# Patient Record
Sex: Female | Born: 1990 | Race: Black or African American | Hispanic: No | Marital: Single | Smoking: Never smoker
Health system: Southern US, Community
[De-identification: ages and names within clinical notes are randomized; demographics above are authoritative.]

## PROBLEM LIST (undated history)

## (undated) ENCOUNTER — Inpatient Hospital Stay (HOSPITAL_COMMUNITY): Payer: Self-pay

## (undated) DIAGNOSIS — B999 Unspecified infectious disease: Secondary | ICD-10-CM

## (undated) DIAGNOSIS — R51 Headache: Secondary | ICD-10-CM

## (undated) DIAGNOSIS — G43109 Migraine with aura, not intractable, without status migrainosus: Principal | ICD-10-CM

## (undated) HISTORY — PX: NO PAST SURGERIES: SHX2092

## (undated) HISTORY — DX: Migraine with aura, not intractable, without status migrainosus: G43.109

## (undated) HISTORY — DX: Headache: R51

---
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
## 2011-06-17 ENCOUNTER — Emergency Department (INDEPENDENT_AMBULATORY_CARE_PROVIDER_SITE_OTHER): Payer: Self-pay

## 2011-06-17 ENCOUNTER — Emergency Department (HOSPITAL_COMMUNITY)
Admission: EM | Admit: 2011-06-17 | Discharge: 2011-06-17 | Disposition: A | Discharge status: Home / Self Care | Payer: Self-pay | Source: Home / Self Care | Attending: Emergency Medicine | Admitting: Emergency Medicine

## 2011-06-17 ENCOUNTER — Encounter (HOSPITAL_COMMUNITY): Payer: Self-pay | Admitting: *Deleted

## 2011-06-17 DIAGNOSIS — R209 Unspecified disturbances of skin sensation: Secondary | ICD-10-CM

## 2011-06-17 DIAGNOSIS — R42 Dizziness and giddiness: Secondary | ICD-10-CM

## 2011-06-17 DIAGNOSIS — M549 Dorsalgia, unspecified: Secondary | ICD-10-CM

## 2011-06-17 DIAGNOSIS — R079 Chest pain, unspecified: Secondary | ICD-10-CM

## 2011-06-17 DIAGNOSIS — R202 Paresthesia of skin: Secondary | ICD-10-CM

## 2011-06-17 LAB — POCT I-STAT, CHEM 8
BUN: 9 mg/dL (ref 6–23)
Calcium, Ion: 1.25 mmol/L (ref 1.12–1.32)
Creatinine, Ser: 0.9 mg/dL (ref 0.50–1.10)
Hemoglobin: 14.3 g/dL (ref 12.0–15.0)
Sodium: 142 mEq/L (ref 135–145)
TCO2: 25 mmol/L (ref 0–100)

## 2011-06-17 NOTE — Discharge Instructions (Signed)
Back Exercises  Back exercises help treat and prevent back injuries. The goal is to increase your strength in your belly (abdominal) and back muscles. These exercises can also help with flexibility. Start these exercises when told by your doctor.  HOME CARE  Back exercises include:  Pelvic Tilt.   Lie on your back with your knees bent. Tilt your pelvis until the lower part of your back is against the floor. Hold this position 5 to 10 sec. Repeat this exercise 5 to 10 times.  Knee to Chest.   Pull 1 knee up against your chest and hold for 20 to 30 seconds. Repeat this with the other knee. This may be done with the other leg straight or bent, whichever feels better. Then, pull both knees up against your chest.  Sit-Ups or Curl-Ups.   Bend your knees 90 degrees. Start with tilting your pelvis, and do a partial, slow sit-up. Only lift your upper half 30 to 45 degrees off the floor. Take at least 2 to 3 seonds for each sit-up. Do not do sit-ups with your knees out straight. If partial sit-ups are difficult, simply do the above but with only tightening your belly (abdominal) muscles and holding it as told.  Hip-Lift.   Lie on your back with your knees flexed 90 degrees. Push down with your feet and shoulders as you raise your hips 2 inches off the floor. Hold for 10 seconds, repeat 5 to 10 times.  Back Arches.   Lie on your stomach. Prop yourself up on bent elbows. Slowly press on your hands, causing an arch in your low back. Repeat 3 to 5 times.  Shoulder-Lifts.   Lie face down with arms beside your body. Keep hips and belly pressed to floor as you slowly lift your head and shoulders off the floor.  Do not overdo your exercises. Be careful in the beginning. Exercises may cause you some mild back discomfort. If the pain lasts for more than 15 minutes, stop the exercises until you see your doctor. Improvement with exercise for back problems is slow.   Document Released: 03/11/2010 Document Revised: 01/26/2011  Document Reviewed: 03/11/2010  [employer] Patient Information 2012 [employer]  Back Pain, Adult  Low back pain is very common. About 1 in 5 people have back pain.The cause of low back pain is rarely dangerous. The pain often gets better over time.About half of people with a sudden onset of back pain feel better in just 2 weeks. About 8 in 10 people feel better by 6 weeks.   CAUSES  Some common causes of back pain include:   Strain of the muscles or ligaments supporting the spine.   Wear and tear (degeneration) of the spinal discs.   Arthritis.   Direct injury to the back.  DIAGNOSIS  Most of the time, the direct cause of low back pain is not known.However, back pain can be treated effectively even when the exact cause of the pain is unknown.Answering your caregiver's questions about your overall health and symptoms is one of the most accurate ways to make sure the cause of your pain is not dangerous. If your caregiver needs more information, he or she may order lab work or imaging tests (X-rays or MRIs).However, even if imaging tests show changes in your back, this usually does not require surgery.  HOME CARE INSTRUCTIONS  For many people, back pain returns.Since low back pain is rarely dangerous, it is often a condition that people can learn to manageon   sit, drive, or stand in one place for more than 30 minutes at a time. Take short walks on level surfaces as soon as pain allows.Try to increase the length of time you walk each day.   Do not stay in bed.Resting more than 1 or 2 days can delay your recovery.   Do not avoid exercise or work.Your body is made to move.It is not dangerous to be active, even though your back may hurt.Your back will likely heal faster if you return to being active before your pain is gone.   Pay attention to your body when you bend and lift. Many people have less  discomfortwhen lifting if they bend their knees, keep the load close to their bodies,and avoid twisting. Often, the most comfortable positions are those that put less stress on your recovering back.   Find a comfortable position to sleep. Use a firm mattress and lie on your side with your knees slightly bent. If you lie on your back, put a pillow under your knees.   Only take over-the-counter or prescription medicines as directed by your caregiver. Over-the-counter medicines to reduce pain and inflammation are often the most helpful.Your caregiver may prescribe muscle relaxant drugs.These medicines help dull your pain so you can more quickly return to your normal activities and healthy exercise.   Put ice on the injured area.   Put ice in a plastic bag.   Place a towel between your skin and the bag.   Leave the ice on for 15 to 20 minutes, 3 to 4 times a day for the first 2 to 3 days. After that, ice and heat may be alternated to reduce pain and spasms.   Ask your caregiver about trying back exercises and gentle massage. This may be of some benefit.   Avoid feeling anxious or stressed.Stress increases muscle tension and can worsen back pain.It is important to recognize when you are anxious or stressed and learn ways to manage it.Exercise is a great option.  SEEK MEDICAL CARE IF:  You have pain that is not relieved with rest or medicine.   You have pain that does not improve in 1 week.   You have new symptoms.   You are generally not feeling well.  SEEK IMMEDIATE MEDICAL CARE IF:   You have pain that radiates from your back into your legs.   You develop new bowel or bladder control problems.   You have unusual weakness or numbness in your arms or legs.   You develop nausea or vomiting.   You develop abdominal pain.   You feel faint.  Document Released: 02/06/2005 Document Revised: 01/26/2011 Document Reviewed: 06/27/2010 [facility] Patient Information 2012 [place],  [place].Chest Pain (Nonspecific) It is often hard to give a specific diagnosis for the cause of chest pain. There is always a chance that your pain could be related to something serious, such as a heart attack or a blood clot in the lungs. You need to follow up with your caregiver for further evaluation. CAUSES   Heartburn.   Pneumonia or bronchitis.   Anxiety or stress.   Inflammation around your heart (pericarditis) or lung (pleuritis or pleurisy).   A blood clot in the lung.   A collapsed lung (pneumothorax). It can develop suddenly on its own (spontaneous pneumothorax) or from injury (trauma) to the chest.   Shingles infection (herpes zoster virus).  The chest wall is composed of bones, muscles, and cartilage. Any of these can be the source of the pain.  The bones can be bruised by injury.   The muscles or cartilage can be strained by coughing or overwork.   The cartilage can be affected by inflammation and become sore (costochondritis).  DIAGNOSIS  Lab tests or other studies, such as X-rays, electrocardiography, stress testing, or cardiac imaging, may be needed to find the cause of your pain.  TREATMENT   Treatment depends on what may be causing your chest pain. Treatment may include:   Acid blockers for heartburn.   Anti-inflammatory medicine.   Pain medicine for inflammatory conditions.   Antibiotics if an infection is present.   You may be advised to change lifestyle habits. This includes stopping smoking and avoiding alcohol, caffeine, and chocolate.   You may be advised to keep your head raised (elevated) when sleeping. This reduces the chance of acid going backward from your stomach into your esophagus.   Most of the time, nonspecific chest pain will improve within 2 to 3 days with rest and mild pain medicine.  HOME CARE INSTRUCTIONS   If antibiotics were prescribed, take your antibiotics as directed. Finish them even if you start to feel better.   For the next  few days, avoid physical activities that bring on chest pain. Continue physical activities as directed.   Do not smoke.   Avoid drinking alcohol.   Only take over-the-counter or prescription medicine for pain, discomfort, or fever as directed by your caregiver.   Follow your caregiver's suggestions for further testing if your chest pain does not go away.   Keep any follow-up appointments you made. If you do not go to an appointment, you could develop lasting (chronic) problems with pain. If there is any problem keeping an appointment, you must call to reschedule.  SEEK MEDICAL CARE IF:   You think you are having problems from the medicine you are taking. Read your medicine instructions carefully.   Your chest pain does not go away, even after treatment.   You develop a rash with blisters on your chest.  SEEK IMMEDIATE MEDICAL CARE IF:   You have increased chest pain or pain that spreads to your arm, neck, jaw, back, or abdomen.   You develop shortness of breath, an increasing cough, or you are coughing up blood.   You have severe back or abdominal pain, feel nauseous, or vomit.   You develop severe weakness, fainting, or chills.   You have a fever.  THIS IS AN EMERGENCY. Do not wait to see if the pain will go away. Get medical help at once. Call your local emergency services (911 in [place] Do not drive yourself to the hospital. MAKE SURE YOU:   Understand these instructions.   Will watch your condition.   Will get help right away if you are not doing well or get worse.  Document Released: 11/16/2004 Document Revised: 01/26/2011 Document Reviewed: 09/12/2007 [facility] Patient Information 2012 [place], [place].Dizziness Dizziness is a common problem. It is a feeling of unsteadiness or lightheadedness. You may feel like you are about to faint. Dizziness can lead to injury if you stumble or fall. A person of any age group can suffer from dizziness, but dizziness is more common in  older adults. CAUSES  Dizziness can be caused by many different things, including:  Middle ear problems.   Standing for too long.   Infections.   An allergic reaction.   Aging.   An emotional response to something, such as the sight of blood.   Side effects of medicines.  Fatigue.   Problems with circulation or blood pressure.   Excess use of alcohol, medicines, or illegal drug use.   Breathing too fast (hyperventilation).   An arrhythmia or problems with your heart rhythm.   Low red blood cell count (anemia).   Pregnancy.   Vomiting, diarrhea, fever, or other illnesses that cause dehydration.   Diseases or conditions such as Parkinson's disease, high blood pressure (hypertension), diabetes, and thyroid problems.   Exposure to extreme heat.  DIAGNOSIS  To find the cause of your dizziness, your caregiver may do a physical exam, lab tests, radiologic imaging scans, or an electrocardiography test (ECG).  TREATMENT  Treatment of dizziness depends on the cause of your symptoms and can vary greatly. HOME CARE INSTRUCTIONS   Drink enough fluids to keep your urine clear or pale yellow. This is especially important in very hot weather. In the elderly, it is also important in cold weather.   If your dizziness is caused by medicines, take them exactly as directed. When taking blood pressure medicines, it is especially important to get up slowly.   Rise slowly from chairs and steady yourself until you feel okay.   In the morning, first sit up on the side of the bed. When this seems okay, stand slowly while holding onto something until you know your balance is fine.   If you need to stand in one place for a long time, be sure to move your legs often. Tighten and relax the muscles in your legs while standing.   If dizziness continues to be a problem, have someone stay with you for a day or two. Do this until you feel you are well enough to stay alone. Have the person call your  caregiver if he or she notices changes in you that are concerning.   Do not drive or use heavy machinery if you feel dizzy.  SEEK IMMEDIATE MEDICAL CARE IF:   Your dizziness or lightheadedness gets worse.   You feel nauseous or vomit.   You develop problems with talking, walking, weakness, or using your arms, hands, or legs.   You are not thinking clearly or you have difficulty forming sentences. It may take a friend or family member to determine if your thinking is normal.   You develop chest pain, abdominal pain, shortness of breath, or sweating.   Your vision changes.   You notice any bleeding.   You have side effects from medicine that seems to be getting worse rather than better.  MAKE SURE YOU:   Understand these instructions.   Will watch your condition.   Will get help right away if you are not doing well or get worse.  Document Released: 08/02/2000 Document Revised: 01/26/2011 Document Reviewed: 08/26/2010 [facility] Patient Information 2012 [place], [place].Neuropathic Pain We often think that pain has a physical cause. If we get rid of the cause, the pain should go away. Nerves themselves can also cause pain. It is called neuropathic pain, which means nerve abnormality. It may be difficult for the patients who have it and for the treating caregivers. Pain is usually described as acute (short-lived) or chronic (long-lasting). Acute pain is related to the physical sensations caused by an injury. It can last from a few seconds to many weeks, but it usually goes away when normal healing occurs. Chronic pain lasts beyond the typical healing time. With neuropathic pain, the nerve fibers themselves may be damaged or injured. They then send incorrect signals to other pain centers. The pain  you feel is real, but the cause is not easy to find.  CAUSES  Chronic pain can result from diseases, such as diabetes and shingles (an infection related to chickenpox), or from trauma, surgery, or  amputation. It can also happen without any known injury or disease. The nerves are sending pain messages, even though there is no identifiable cause for such messages.   Other common causes of neuropathy include diabetes, phantom limb pain, or Regional Pain Syndrome (RPS).   As with all forms of chronic back pain, if neuropathy is not correctly treated, there can be a number of associated problems that lead to a downward cycle for the patient. These include depression, sleeplessness, feelings of fear and anxiety, limited social interaction and inability to do normal daily activities or work.   The most dramatic and mysterious example of neuropathic pain is called "phantom limb syndrome." This occurs when an arm or a leg has been removed because of illness or injury. The brain still gets pain messages from the nerves that originally carried impulses from the missing limb. These nerves now seem to misfire and cause troubling pain.   Neuropathic pain often seems to have no cause. It responds poorly to standard pain treatment.  Neuropathic pain can occur after:  Shingles (herpes zoster virus infection).   A lasting burning sensation of the skin, caused usually by injury to a peripheral nerve.   Peripheral neuropathy which is widespread nerve damage, often caused by diabetes or alcoholism.   Phantom limb pain following an amputation.   Facial nerve problems (trigeminal neuralgia).   Multiple sclerosis.   Reflex sympathetic dystrophy.   Pain which comes with cancer and cancer chemotherapy.   Entrapment neuropathy such as when pressure is put on a nerve such as in carpal tunnel syndrome.   Back, leg, and hip problems (sciatica).   Spine or back surgery.   HIV Infection or AIDS where nerves are infected by viruses.  Your caregiver can explain items in the above list which may apply to you. SYMPTOMS  Characteristics of neuropathic pain are:  Severe, sharp, electric shock-like, shooting,  lightening-like, knife-like.   Pins and needles sensation.   Deep burning, deep cold, or deep ache.   Persistent numbness, tingling, or weakness.   Pain resulting from light touch or other stimulus that would not usually cause pain.   Increased sensitivity to something that would normally cause pain, such as a pinprick.  Pain may persist for months or years following the healing of damaged tissues. When this happens, pain signals no longer sound an alarm about current injuries or injuries about to happen. Instead, the alarm system itself is not working correctly.  Neuropathic pain may get worse instead of better over time. For some people, it can lead to serious disability. It is important to be aware that severe injury in a limb can occur without a proper, protective pain response.Burns, cuts, and other injuries may go unnoticed. Without proper treatment, these injuries can become infected or lead to further disability. Take any injury seriously, and consult your caregiver for treatment. DIAGNOSIS  When you have a pain with no known cause, your caregiver will probably ask some specific questions:   Do you have any other conditions, such as diabetes, shingles, multiple sclerosis, or HIV infection?   How would you describe your pain? (Neuropathic pain is often described as shooting, stabbing, burning, or searing.)   Is your pain worse at any time of the day? (Neuropathic pain is usually  worse at night.)   Does the pain seem to follow a certain physical pathway?   Does the pain come from an area that has missing or injured nerves? (An example would be phantom limb pain.)   Is the pain triggered by minor things such as rubbing against the sheets at night?  These questions often help define the type of pain involved. Once your caregiver knows what is happening, treatment can begin. Anticonvulsant, antidepressant drugs, and various pain relievers seem to work in some cases. If another  condition, such as diabetes is involved, better management of that disorder may relieve the neuropathic pain.  TREATMENT  Neuropathic pain is frequently long-lasting and tends not to respond to treatment with narcotic type pain medication. It may respond well to other drugs such as antiseizure and antidepressant medications. Usually, neuropathic problems do not completely go away, but partial improvement is often possible with proper treatment. Your caregivers have large numbers of medications available to treat you. Do not be discouraged if you do not get immediate relief. Sometimes different medications or a combination of medications will be tried before you receive the results you are hoping for. See your caregiver if you have pain that seems to be coming from nowhere and does not go away. Help is available.  SEEK IMMEDIATE MEDICAL CARE IF:   There is a sudden change in the quality of your pain, especially if the change is on only one side of the body.   You notice changes of the skin, such as redness, black or purple discoloration, swelling, or an ulcer.   You cannot move the affected limbs.  Document Released: 11/04/2003 Document Revised: 01/26/2011 Document Reviewed: 11/04/2003 [facility] Patient Information 2012 [place], [place].

## 2011-06-17 NOTE — ED Provider Notes (Signed)
Chief Complaint  Patient presents with  . Numbness  . Chest Pain  . Dizziness    History of Present Illness:   Marie Hampton is a 21 year old female who developed numbness and tingling in her entire left arm from the shoulder on down to the tips of the fingers 4 days ago. This has persisted. It feels like a tightness in the arm feels weak and 2 days ago the same thing happened with her left leg with numbness, tingling, tightness, and a weak feeling. She denies any swelling, pain in the arm or leg, headache, diplopia, blurred vision, or difficulty with speech, swallowing, or ambulation. Over the past 3 days she's felt dizzy and lightheaded. She denies any vertigo or presyncope. This is worse with head movement. She had chest pain off-and-on for the past one to 2 years. This is sharp and left temporal lasting about 5 seconds at a time and worse if she moves. This comes and goes and is unrelated to position, exertion, activity, or meals. She is at a today history of nausea but no vomiting. This tends to occur after meals she denies abdominal pain or diarrhea. Since this morning she's had pain in her left lower back area. She also has hot spells and occasional headaches. She denies any fever, chills, sore throat, swollen glands, neck pain, coughing, wheezing, shortness of breath, urinary or GYN complaints.  Review of Systems:  Other than noted above, the patient denies any of the following symptoms: Systemic:  No fever, chills, fatigue, photophobia, stiff neck. Eye:  No redness, eye pain, discharge, blurred vision, or diplopia. ENT:  No nasal congestion, rhinorrhea, sinus pressure or pain, sneezing, earache, or sore throat.  No jaw claudication. Neuro:  No paresthesias, loss of consciousness, seizure activity, muscle weakness, trouble with coordination or gait, trouble speaking or swallowing. Psych:  No depression, anxiety or trouble sleeping.  PMFSH:  Past medical history, family history, social history,  meds, and allergies were reviewed.  Physical Exam:   Vital signs:  BP 112/71  Pulse 80  Temp(Src) 98.6 F (37 C) (Oral)  Resp 18  SpO2 100%  LMP 06/13/2011 General:  Alert and oriented.  In no distress. Eye:  Lids and conjunctivas normal.  PERRL,  Full EOMs.  Fundi benign with normal discs and vessels. ENT:  No cranial or facial tenderness to palpation.  TMs and canals clear.  Nasal mucosa was normal and uncongested without any drainage. No intra oral lesions, pharynx clear, mucous membranes moist, dentition normal. Neck:  Supple, full ROM, no tenderness to palpation.  No adenopathy or mass. Lungs: Clear to auscultation. Heart: Regular rhythm, no gallops or murmurs. Chest: No chest wall tenderness. Abdomen: Soft, nontender, no organomegaly or mass. Bowel sounds are normal. Extremities: No edema or tenderness, pulses were full. Neuro:  Alert and orented times 3.  Speech was clear, fluent, and appropriate.  Cranial nerves intact. No pronator drift, muscle strength normal. Finger to nose normal.  DTRs 2+ .Station and gait were normal.  Romberg's sign was normal.  Able to perform tandem gait well. Psych:  Normal affect.  Dg Chest 2 View  06/17/2011  *RADIOLOGY REPORT*  Clinical Data: Chest pain for 2 years  CHEST - 2 VIEW  Comparison: 10/29/2005  Findings: The heart size and mediastinal contours are within normal limits.  Both lungs are clear.  The visualized skeletal structures are unremarkable.  IMPRESSION: Negative exam.  Original Report Authenticated By: Rosealee Albee, M.D.   Results for orders placed during  the hospital encounter of 06/17/11  POCT I-STAT, CHEM 8      Component Value Range   Sodium 142  135 - 145 (mEq/L)   Potassium 4.1  3.5 - 5.1 (mEq/L)   Chloride 106  96 - 112 (mEq/L)   BUN 9  6 - 23 (mg/dL)   Creatinine, Ser 1.61  0.50 - 1.10 (mg/dL)   Glucose, Bld 91  70 - 99 (mg/dL)   Calcium, Ion 0.96  0.45 - 1.32 (mmol/L)   TCO2 25  0 - 100 (mmol/L)   Hemoglobin 14.3   12.0 - 15.0 (g/dL)   HCT 40.9  81.1 - 91.4 (%)     Date: 06/17/2011  Rate: 82  Rhythm: sinus arrhythmia  QRS Axis: normal  Intervals: normal  ST/T Wave abnormalities: normal  Conduction Disutrbances:none  Narrative Interpretation: Normal sinus rhythm with sinus arrhythmia, otherwise normal EKG.  Old EKG Reviewed: none available    Assessment:  The primary encounter diagnosis was Paresthesias. Diagnoses of Chest pain, Back pain, and Dizziness were also pertinent to this visit. The cause of her symptoms remains unknown. I am concerned about the possibility of multiple sclerosis, and have suggested that she followup with a neurologist. There is no evidence of a stroke or any o    ther acute neurological illness.  Plan:   1.  The following meds were prescribed:   New Prescriptions   No medications on file   2.  The patient was instructed in symptomatic care and handouts were given. 3.  The patient was told to return if becoming worse in any way, if no better in 3 or 4 days, and given some red flag symptoms that would indicate earlier return.  Follow up with Dr. Porfirio Mylar Dohmeier next week.    Reuben Likes, MD 06/17/11 2113

## 2011-06-17 NOTE — ED Notes (Signed)
C/O constant LUE x 2 days, that progressed into LLE as well yesterday.  Today numbness is intermittent.  Also c/o left chest pains to side of breast intermittently over past few days.  C/O lightheadedness intermittently x 2 days.  Has taken IBU without change in sxs.

## 2012-04-10 ENCOUNTER — Encounter (HOSPITAL_COMMUNITY): Payer: Self-pay | Admitting: Emergency Medicine

## 2012-04-10 DIAGNOSIS — G43909 Migraine, unspecified, not intractable, without status migrainosus: Secondary | ICD-10-CM | POA: Insufficient documentation

## 2012-04-10 DIAGNOSIS — Z3202 Encounter for pregnancy test, result negative: Secondary | ICD-10-CM | POA: Insufficient documentation

## 2012-04-10 NOTE — ED Notes (Addendum)
C/o headache x 2 weeks with intermittent episodes of alternating arms feeling "weird."  Reports LMP started today and is 1 week early. No neuro deficits noted on triage exam.

## 2012-04-11 ENCOUNTER — Encounter (HOSPITAL_COMMUNITY): Payer: Self-pay | Admitting: Radiology

## 2012-04-11 ENCOUNTER — Emergency Department (HOSPITAL_COMMUNITY): Payer: Self-pay

## 2012-04-11 ENCOUNTER — Emergency Department (HOSPITAL_COMMUNITY)
Admission: EM | Admit: 2012-04-11 | Discharge: 2012-04-11 | Disposition: A | Discharge status: Home / Self Care | Payer: Self-pay | Attending: Emergency Medicine | Admitting: Emergency Medicine

## 2012-04-11 MED ORDER — KETOROLAC TROMETHAMINE 60 MG/2ML IM SOLN
60.0000 mg | Freq: Once | INTRAMUSCULAR | Status: AC
  Administered 2012-04-11: 60 mg via INTRAMUSCULAR
  Filled 2012-04-11: qty 2

## 2012-04-11 MED ORDER — METOCLOPRAMIDE HCL 5 MG/ML IJ SOLN
10.0000 mg | Freq: Once | INTRAMUSCULAR | Status: AC
  Administered 2012-04-11: 10 mg via INTRAMUSCULAR
  Filled 2012-04-11: qty 2

## 2012-04-11 MED ORDER — DIPHENHYDRAMINE HCL 50 MG/ML IJ SOLN
12.5000 mg | Freq: Once | INTRAMUSCULAR | Status: AC
  Administered 2012-04-11: 12.5 mg via INTRAMUSCULAR
  Filled 2012-04-11: qty 1

## 2012-04-11 NOTE — ED Notes (Signed)
Pt discharged.Vital signs stable and GCS 15 

## 2012-04-11 NOTE — ED Provider Notes (Signed)
History     CSN: [id number]  Arrival date & time 04/10/12  2144   First MD Initiated Contact with Patient 04/11/12 0123      Chief Complaint  Patient presents with  . Headache    (Consider location/radiation/quality/duration/timing/severity/associated sxs/prior treatment) Patient is a 22 y.o. female presenting with headaches. The history is provided by the patient.  Headache Pain location:  R parietal Quality:  Dull Radiates to:  Does not radiate Severity currently:  7/10 Severity at highest:  7/10 Onset quality:  Gradual Duration:  2 weeks Timing:  Constant Progression:  Unchanged Chronicity:  Recurrent Similar to prior headaches: yes   Context: not intercourse   Relieved by:  NSAIDs Worsened by:  Nothing tried Ineffective treatments:  None tried Associated symptoms: no back pain, no fever, no nausea, no neck pain, no photophobia and no seizures     History reviewed. No pertinent past medical history.  History reviewed. No pertinent past surgical history.  No family history on file.  History  Substance Use Topics  . Smoking status: Not on file  . Smokeless tobacco: Not on file  . Alcohol Use: Yes     Comment: Occasional    OB History   Grav Para Term Preterm Abortions TAB SAB Ect Mult Living                  Review of Systems  Constitutional: Negative for fever.  HENT: Negative for neck pain.   Eyes: Negative for photophobia.  Gastrointestinal: Negative for nausea.  Musculoskeletal: Negative for back pain.  Neurological: Positive for headaches. Negative for seizures, speech difficulty and weakness.  All other systems reviewed and are negative.    Allergies  Review of patient's allergies indicates no known allergies.  Home Medications   Current Outpatient Rx  Name  Route  Sig  Dispense  Refill  . Norethindrone-Eth Estradiol (BREVICON, 28, PO)   Oral   Take by mouth.           BP 115/75  Pulse 73  Temp(Src) 98.8 F (37.1 C) (Oral)   Resp 18  SpO2 100%  LMP 04/10/2012  Physical Exam  Constitutional: She is oriented to person, place, and time. She appears well-developed and well-nourished.  HENT:  Head: Normocephalic and atraumatic.  Mouth/Throat: Oropharynx is clear and moist.  Eyes: Conjunctivae and EOM are normal. Pupils are equal, round, and reactive to light.  Neck: Normal range of motion. Neck supple.  No meningeal signs no temporal pain  Cardiovascular: Normal rate, regular rhythm and intact distal pulses.   Pulmonary/Chest: Effort normal and breath sounds normal.  Abdominal: Soft. Bowel sounds are normal. There is no tenderness.  Musculoskeletal: Normal range of motion.  Neurological: She is alert and oriented to person, place, and time. She displays normal reflexes. No cranial nerve deficit. Coordination normal.  5/5 strength throughout, sensation intact B  Skin: Skin is warm and dry.  Psychiatric: She has a normal mood and affect.    ED Course  Procedures (including critical care time)  Labs Reviewed  POCT PREGNANCY, URINE   No results found.   No diagnosis found.    MDM  Exam and vitals reassuring.  There is no change in cognition.  No fevers.  No neck pain no indication for LP.  Follow up for ongoing care.  Return for changes in mental status fever or stiff neck        Hawley Michel K Deliyah Muckle-Rasch, MD 04/11/12 [phone]

## 2012-04-19 ENCOUNTER — Encounter: Payer: Self-pay | Admitting: Neurology

## 2012-04-19 DIAGNOSIS — G43111 Migraine with aura, intractable, with status migrainosus: Secondary | ICD-10-CM | POA: Insufficient documentation

## 2012-05-13 ENCOUNTER — Ambulatory Visit (INDEPENDENT_AMBULATORY_CARE_PROVIDER_SITE_OTHER): Payer: PRIVATE HEALTH INSURANCE | Admitting: Neurology

## 2012-05-13 ENCOUNTER — Encounter: Payer: Self-pay | Admitting: Neurology

## 2012-05-13 VITALS — BP 108/78 | HR 92 | Temp 98.9°F | Ht 62.0 in | Wt 143.0 lb

## 2012-05-13 DIAGNOSIS — G43109 Migraine with aura, not intractable, without status migrainosus: Secondary | ICD-10-CM | POA: Insufficient documentation

## 2012-05-13 HISTORY — DX: Migraine with aura, not intractable, without status migrainosus: G43.109

## 2012-05-13 NOTE — Patient Instructions (Addendum)
I think overall you are doing well and your exam is normal. Your migraine medicine, gabapentin, can be taken as needed at night.  Please make sure that you drink plenty of fluids. I would like for you to exercise daily for example in the form of walking 20-30 minutes every day. Please keep regular sleep-wake schedule. And keep regular mealtimes, do not skip any meals, eat  healthy snacks in between meals. Try to eat protein with every meal. Let's not make any changes in your medications at this point. I think you're stable enough that I can see you back as needed. Please call us if you have any interim questions, concerns, or problems or updates to discuss. Our phone number is [phone] and Brett Canales is my Geophysicist/field seismologist.

## 2012-05-13 NOTE — Progress Notes (Signed)
Subjective:    Patient ID: Marie Hampton is a 22 y.o. female.  HPI Interim history:  Marie Hampton is a very pleasant 22 y.o.-year-old Right-handed female, with a benign PMH, who presents for followup consultation of her HAs. The patient is accompanied by her sister today. I first met her and her boyfriend on 04/12/2012 at which time I suggested a trial of gabapentin for her ongoing migrainous headaches. At that time she was reporting a continuous headache, going on for 2 weeks. She had presented to the [facility] ER for a gradual onset R posterior-parietal HA, described as burning and throbbing initially, then constant pain, which then traveled to L side. Severity was mostly 7/10, but also 10/10. She has had almost complete resolution of her headaches. She had reported intermittent tingling in L and less so on R arm. She had associated photophobia, sonophobia and nausea, no vomiting. Went to [facility] ER 04/11/12 and had CTH, which was N. I reviewed the CT report and had explained the findings to her. She received 3 IM shots, one of which was Benadrly, and Toradol, per BF. She is doing better, she states. She did build up the gabapentin dose as instructed to 3 pill qHS and took this for about a week and since she felt improved, she discontinued the med. She has an intermittent mild HA off and on now and not more severe than 5/10. She is without new complaints today. She had no SEs with the gabapentin.    Her  Past Medical History  Diagnosis Date  . Headache   . Migraine with aura 05/13/2012    Her History reviewed. No pertinent past surgical history.  Her  Family History  Problem Relation Age of Onset  . Diabetes Other   . Heart disease Other     Her  History   Social History  . Marital Status: Married    Spouse Name: N/A    Number of Children: N/A  . Years of Education: N/A   Occupational History  . Student    Social History Main Topics  . Smoking status: Never Smoker   . Smokeless  tobacco: None  . Alcohol Use: 1.5 oz/week    3 drink(s) per week     Comment: Occasional  . Drug Use: 1.00 per week    Special: Marijuana  . Sexually Active: None   Other Topics Concern  . None   Social History Narrative    Right handed 22 year old AA female is not married and lives at home with her mother, father and sister.  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  She occasionally drinks alcohol and uses recreational drugs socially.    Her No Known Allergies:   Her  Outpatient Encounter Prescriptions as of 05/13/2012  Medication Sig Dispense Refill  . aspirin-acetaminophen-caffeine (EXCEDRIN MIGRAINE) 250-250-65 MG per tablet Take 1 tablet by mouth once.      . gabapentin (NEURONTIN) 100 MG capsule Take 100 mg by mouth 3 (three) times daily.       No facility-administered encounter medications on file as of 05/13/2012.   Review of Systems  Constitutional: Positive for fatigue.  HENT: Positive for rhinorrhea.        Seasonal allergies  Respiratory: Positive for cough.   Gastrointestinal: Positive for diarrhea.  Neurological: Positive for headaches.    Objective:  Neurologic Exam  Physical Exam  Physical Examination:   Filed Vitals:   05/13/12 1129  BP: 108/78  Pulse: 92  Temp: 98.9 F (37.2 C)    General Examination: The patient is a very pleasant 22 y.o. female in no acute distress.  On general exam: She is a very pleasant young female in no acute distress. She is currently having a headache 4/10, but she admits to not having breakfast this morning. She appears comfortable. She is conversant. She is no photophobic. HEENT exam: Normocephalic, atraumatic, pupils are equal, round and reactive to light and accommodation. Funduscopic exam is normal bilaterally. There is cerumen impaction on the L. Hearing is intact. Face is symmetric with normal facial animation. She has normal speech. Neck is supple with full range of motion. She has no meningeal signs.  Oropharynx is clear. Tonsils are 2+. They have a mildly cryptic appearance. She has no pharyngeal erythema or postnasal drip. Tongue protrudes centrally and palate elevates symmetrically. Chest is clear to auscultation without wheezing or rhonchi. Heart sounds are normal without murmurs, rubs or gallops. Abdomen is soft, nontender with normal bowel sounds. She has no pitting edema in the distal lower extremities bilaterally. Skin is warm and dry. She has no joint deformities. She has a few tattoos up of her right collarbone and and posterior neck. Neurologically: Mental status: The patient is awake, alert and oriented in all 4 spheres. Her memory, attention, language and knowledge are appropriate. Cranial nerves are as described under HEENT exam. Motor exam: Normal bulk, strength and tone is noted. She has no drift or tremor or rebound. Romberg is negative. Reflexes are 2+ throughout. Cerebellar testing shows no dysmetria or intention tremor on finger to nose testing. She has intact fine motor skills. She has no truncal or gait ataxia. Sensory exam is intact to light touch, pinprick, vibration and temperature sense. Gait, station and balance are unremarkable. She is able to perform tandem walk and pull herself up on her toes and heels.  Assessment and Plan:     In summary, Ms. Deckman is a very pleasant 22 year old woman with a history consistent with status migrainosus, now resolved. She has a normal neurological exam and looks well.. She is advised that she has a nonfocal exam and normal head CT both of which are very reassuring. She is advised about the diagnosis of migraine w aura, its prognosis and treatment options. I would for her to use gabapentin as needed. She is stable and can FU with me on an as needed basis. She was in agreement. If she has any new onset symptoms such as one-sided weakness, numbness, sudden vision loss or slurring of speech she is advised to go to the emergency room. She is  advised to call with any other problems, concerns or questions or updates over the phone next week.

## 2015-02-21 NOTE — L&D Delivery Note (Signed)
Delivery Note 0154: At bedside after nurse reports patient C/C/+2 and pushing with good efforts.  Cat II FT as variable decelerations noted with pushing efforts.  Patient with O2 via NRB mask and s/p fluid bolus.  Patient delivered as below with staff and family support.   At 2:21 AM, on Nov. 30, 2017, a viable female "Marie Hampton" was delivered via Vaginal, Spontaneous Delivery (Presentation: Right Occiput Anterior with restitution to ROT). Shoulders delivered easily and mother encouraged to embrace infant and deliver body. Infant placed on mother's abdomen with semi-flaccid tone and minimal grimace. Tactile stimulation given by provider and infant with improving tone and grimace.  Infant APGAR: 7,9. Cord clamped, cut, and blood collected for donation. Placenta delivered spontaneously, after 40 minutes, and noted to be intact with 3VC upon inspection.  Vaginal inspection revealed a 2nd degree perineal laceration that was repaired with 3-0 vicryl on CT-1.  Additional anesthetic necessary and patient tolerated the procedure well. Fundus firm, at the umbilicus, and bleeding small.  Mother hemodynamically stable and infant skin to skin prior to provider exit.  Mother desires birth control, but is unsure of method.  Patient does opt to breastfeed.  Infant weight at one hour of life: 7lbs 3oz, 19.75in  Anesthesia:  Epidural Episiotomy: None Lacerations: 2nd degree;Perineal Suture Repair: 3.0 vicryl Est. Blood Loss (mL): 400  Mom to postpartum.  Baby to Couplet care / Skin to Skin.  Marie RobinsJessica L Legacy Carrender MSN, CNM 01/20/2016, 3:36 AM

## 2015-05-31 ENCOUNTER — Ambulatory Visit (HOSPITAL_COMMUNITY)
Admission: EM | Admit: 2015-05-31 | Discharge: 2015-05-31 | Disposition: A | Discharge status: Home / Self Care | Payer: BLUE CROSS/BLUE SHIELD | Attending: Emergency Medicine | Admitting: Emergency Medicine

## 2015-05-31 ENCOUNTER — Encounter (HOSPITAL_COMMUNITY): Payer: Self-pay | Admitting: Emergency Medicine

## 2015-05-31 DIAGNOSIS — R102 Pelvic and perineal pain: Secondary | ICD-10-CM | POA: Diagnosis not present

## 2015-05-31 DIAGNOSIS — O26899 Other specified pregnancy related conditions, unspecified trimester: Secondary | ICD-10-CM

## 2015-05-31 LAB — POCT URINALYSIS DIP (DEVICE)
BILIRUBIN URINE: NEGATIVE
GLUCOSE, UA: NEGATIVE mg/dL
Hgb urine dipstick: NEGATIVE
KETONES UR: NEGATIVE mg/dL
LEUKOCYTES UA: NEGATIVE
NITRITE: NEGATIVE
Protein, ur: NEGATIVE mg/dL
Specific Gravity, Urine: 1.015 (ref 1.005–1.030)
Urobilinogen, UA: 0.2 mg/dL (ref 0.0–1.0)
pH: 7.5 (ref 5.0–8.0)

## 2015-05-31 LAB — POCT PREGNANCY, URINE: Preg Test, Ur: POSITIVE — AB

## 2015-05-31 NOTE — ED Provider Notes (Signed)
CSN: [id number]     Arrival date & time 05/31/15  1358 History   First MD Initiated Contact with Patient 05/31/15 1510     Chief Complaint  Patient presents with  . Abdominal Pain   (Consider location/radiation/quality/duration/timing/severity/associated sxs/prior Treatment) HPI Comments: 25 year old female complaining of epigastric pain for one week. It is intermittent and generally last for approximately 30 minutes or so it time. It occurs almost days of the week. Sometimes it is better after drinking water. Nothing seems to make it worse. She considers it to be relatively mild. The second complaint is that of mid pelvic pain for one week. It is constant and mild to moderate. It does not abate. There is no associated nausea, vomiting or diarrhea. Denies chest pain or shortness of breath. Denies vaginal bleeding or discharge. Last menstrual period was 04/19/2015. She has a nearly positive pregnancy test today. Urinalysis is clear.   Past Medical History  Diagnosis Date  . Headache(784.0)   . Migraine with aura 05/13/2012   History reviewed. No pertinent past surgical history. Family History  Problem Relation Age of Onset  . Diabetes Other   . Heart disease Other    Social History  Substance Use Topics  . Smoking status: Never Smoker   . Smokeless tobacco: None  . Alcohol Use: 1.5 oz/week    3 drink(s) per week     Comment: Occasional   OB History    No data available     Review of Systems  Constitutional: Negative for fever, activity change and fatigue.  HENT: Negative.   Respiratory: Negative.  Negative for cough and shortness of breath.   Cardiovascular: Negative.   Gastrointestinal: Positive for abdominal pain. Negative for nausea, vomiting, diarrhea and constipation.  Genitourinary: Positive for menstrual problem and pelvic pain. Negative for dysuria, urgency, frequency, hematuria, flank pain, vaginal bleeding, vaginal discharge and genital sores.  Musculoskeletal:  Negative.   Neurological: Negative.     Allergies  Review of patient's allergies indicates no known allergies.  Home Medications   Prior to Admission medications   Medication Sig Start Date End Date Taking? Authorizing Provider  aspirin-acetaminophen-caffeine (EXCEDRIN MIGRAINE) [phone] MG per tablet Take 1 tablet by mouth once.    Historical Provider, MD  gabapentin (NEURONTIN) 100 MG capsule Take 100 mg by mouth 3 (three) times daily.    Historical Provider, MD   Meds Ordered and Administered this Visit  Medications - No data to display  BP 114/65 mmHg  Pulse 93  Temp(Src) 99.8 F (37.7 C) (Oral)  Resp 16  SpO2 100%  LMP 04/19/2015 No data found.   Physical Exam  Constitutional: She is oriented to person, place, and time. She appears well-developed and well-nourished. No distress.  Eyes: EOM are normal.  Neck: Normal range of motion. Neck supple.  Cardiovascular: Normal rate.   Pulmonary/Chest: Effort normal. No respiratory distress.  Abdominal: Soft. Bowel sounds are normal. She exhibits no distension and no mass. There is no tenderness. There is no rebound and no guarding.  Minor tenderness over the mid suprapubis. No bilateral pelvic tenderness. No other abdominal tenderness.  Musculoskeletal: She exhibits no edema.  Neurological: She is alert and oriented to person, place, and time. She exhibits normal muscle tone.  Skin: Skin is warm and dry.  Psychiatric: She has a normal mood and affect.  Nursing note and vitals reviewed.   ED Course  Procedures (including critical care time)  Labs Review Labs Reviewed  POCT PREGNANCY, URINE - Abnormal; Notable  for the following:    Preg Test, Ur POSITIVE (*)    All other components within normal limits  POCT URINALYSIS DIP (DEVICE)   Results for orders placed or performed during the hospital encounter of 05/31/15  POCT urinalysis dip (device)  Result Value Ref Range   Glucose, UA NEGATIVE NEGATIVE mg/dL   Bilirubin  Urine NEGATIVE NEGATIVE   Ketones, ur NEGATIVE NEGATIVE mg/dL   Specific Gravity, Urine 1.015 1.005 - 1.030   Hgb urine dipstick NEGATIVE NEGATIVE   pH 7.5 5.0 - 8.0   Protein, ur NEGATIVE NEGATIVE mg/dL   Urobilinogen, UA 0.2 0.0 - 1.0 mg/dL   Nitrite NEGATIVE NEGATIVE   Leukocytes, UA NEGATIVE NEGATIVE  Pregnancy, urine POC  Result Value Ref Range   Preg Test, Ur POSITIVE (A) NEGATIVE     Imaging Review No results found.   Visual Acuity Review  Right Eye Distance:   Left Eye Distance:   Bilateral Distance:    Right Eye Near:   Left Eye Near:    Bilateral Near:         MDM   1. Pelvic pain during pregnancy    Patient with positive pregnancy test today. She is having mild low mid pelvic/suprapubic pain and tenderness. She is in no distress. Vital signs are stable, relaxed posturing smiling and jovial. She is accompanied by her significant other that can drive [facility] additional evaluation at this time. She understands that she needs to be evaluated today.  Verbal and written discharge instructions have been reviewed and given to the patient  by the provider to include medications and other care measures.   Hayden Rasmussenavid Omni Dunsworth, NP 05/31/15 1555  Hayden Rasmussenavid Marquesa Rath, NP 05/31/15 1556

## 2015-05-31 NOTE — Discharge Instructions (Signed)
Go directly to the [facility] now.  Pelvic Pain, Female Female pelvic pain can be caused by many different things and start from a variety of places. Pelvic pain refers to pain that is located in the lower half of the abdomen and between your hips. The pain may occur over a short period of time (acute) or may be reoccurring (chronic). The cause of pelvic pain may be related to disorders affecting the female reproductive organs (gynecologic), but it may also be related to the bladder, kidney stones, an intestinal complication, or muscle or skeletal problems. Getting help right away for pelvic pain is important, especially if there has been severe, sharp, or a sudden onset of unusual pain. It is also important to get help right away because some types of pelvic pain can be life threatening.  CAUSES  Below are only some of the causes of pelvic pain. The causes of pelvic pain can be in one of several categories.   Gynecologic.  Pelvic inflammatory disease.  Sexually transmitted infection.  Ovarian cyst or a twisted ovarian ligament (ovarian torsion).  Uterine lining that grows outside the uterus (endometriosis).  Fibroids, cysts, or tumors.  Ovulation.  Pregnancy.  Pregnancy that occurs outside the uterus (ectopic pregnancy).  Miscarriage.  Labor.  Abruption of the placenta or ruptured uterus.  Infection.  Uterine infection (endometritis).  Bladder infection.  Diverticulitis.  Miscarriage related to a uterine infection (septic abortion).  Bladder.  Inflammation of the bladder (cystitis).  Kidney stone(s).  Gastrointestinal.  Constipation.  Diverticulitis.  Neurologic.  Trauma.  Feeling pelvic pain because of mental or emotional causes (psychosomatic).  Cancers of the bowel or pelvis. EVALUATION  Your caregiver will want to take a careful history of your concerns. This includes recent changes in your health, a careful gynecologic history of your periods  (menses), and a sexual history. Obtaining your family history and medical history is also important. Your caregiver may suggest a pelvic exam. A pelvic exam will help identify the location and severity of the pain. It also helps in the evaluation of which organ system may be involved. In order to identify the cause of the pelvic pain and be properly treated, your caregiver may order tests. These tests may include:   A pregnancy test.  Pelvic ultrasonography.  An X-ray exam of the abdomen.  A urinalysis or evaluation of vaginal discharge.  Blood tests. HOME CARE INSTRUCTIONS   Only take over-the-counter or prescription medicines for pain, discomfort, or fever as directed by your caregiver.   Rest as directed by your caregiver.   Eat a balanced diet.   Drink enough fluids to make your urine clear or pale yellow, or as directed.   Avoid sexual intercourse if it causes pain.   Apply warm or cold compresses to the lower abdomen depending on which one helps the pain.   Avoid stressful situations.   Keep a journal of your pelvic pain. Write down when it started, where the pain is located, and if there are things that seem to be associated with the pain, such as food or your menstrual cycle.  Follow up with your caregiver as directed.  SEEK MEDICAL CARE IF:  Your medicine does not help your pain.  You have abnormal vaginal discharge. SEEK IMMEDIATE MEDICAL CARE IF:   You have heavy bleeding from the vagina.   Your pelvic pain increases.   You feel light-headed or faint.   You have chills.   You have pain with urination or blood  in your urine.   You have uncontrolled diarrhea or vomiting.   You have a fever or persistent symptoms for more than 3 days.  You have a fever and your symptoms suddenly get worse.   You are being physically or sexually abused.   This information is not intended to replace advice given to you by your health care provider. Make sure  you discuss any questions you have with your health care provider.   Document Released: 01/04/2004 Document Revised: 10/28/2014 Document Reviewed: 05/29/2011 Elsevier Interactive Patient Education 2016 [employer]  Abdominal Pain During Pregnancy Belly (abdominal) pain is common during pregnancy. Most of the time, it is not a serious problem. Other times, it can be a sign that something is wrong with the pregnancy. Always tell your doctor if you have belly pain. HOME CARE Monitor your belly pain for any changes. The following actions may help you feel better:  Do not have sex (intercourse) or put anything in your vagina until you feel better.  Rest until your pain stops.  Drink clear fluids if you feel sick to your stomach (nauseous). Do not eat solid food until you feel better.  Only take medicine as told by your doctor.  Keep all doctor visits as told. GET HELP RIGHT AWAY IF:   You are bleeding, leaking fluid, or pieces of tissue come out of your vagina.  You have more pain or cramping.  You keep throwing up (vomiting).  You have pain when you pee (urinate) or have blood in your pee.  You have a fever.  You do not feel your baby moving as much.  You feel very weak or feel like passing out.  You have trouble breathing, with or without belly pain.  You have a very bad headache and belly pain.  You have fluid leaking from your vagina and belly pain.  You keep having watery poop (diarrhea).  Your belly pain does not go away after resting, or the pain gets worse. MAKE SURE YOU:   Understand these instructions.  Will watch your condition.  Will get help right away if you are not doing well or get worse.   This information is not intended to replace advice given to you by your health care provider. Make sure you discuss any questions you have with your health care provider.   Document Released: 01/25/2009 Document Revised: 10/09/2012 Document Reviewed:  09/05/2012 Elsevier Interactive Patient Education [employer]

## 2015-05-31 NOTE — ED Notes (Signed)
Here with upper/lower abdominal cramping that started 1 week ago Denies n,v,d, or vaginal d/c LMP- 04/19/15

## 2015-06-01 ENCOUNTER — Encounter (HOSPITAL_COMMUNITY): Payer: Self-pay | Admitting: *Deleted

## 2015-06-01 ENCOUNTER — Inpatient Hospital Stay (HOSPITAL_COMMUNITY): Payer: BLUE CROSS/BLUE SHIELD

## 2015-06-01 ENCOUNTER — Inpatient Hospital Stay (HOSPITAL_COMMUNITY)
Admission: AD | Admit: 2015-06-01 | Discharge: 2015-06-01 | Disposition: A | Discharge status: Home / Self Care | Payer: BLUE CROSS/BLUE SHIELD | Source: Ambulatory Visit | Attending: Family Medicine | Admitting: Family Medicine

## 2015-06-01 DIAGNOSIS — O26891 Other specified pregnancy related conditions, first trimester: Secondary | ICD-10-CM

## 2015-06-01 DIAGNOSIS — R1013 Epigastric pain: Secondary | ICD-10-CM | POA: Diagnosis not present

## 2015-06-01 DIAGNOSIS — R109 Unspecified abdominal pain: Secondary | ICD-10-CM | POA: Diagnosis present

## 2015-06-01 DIAGNOSIS — R102 Pelvic and perineal pain: Secondary | ICD-10-CM

## 2015-06-01 DIAGNOSIS — G43909 Migraine, unspecified, not intractable, without status migrainosus: Secondary | ICD-10-CM | POA: Diagnosis not present

## 2015-06-01 DIAGNOSIS — Z3491 Encounter for supervision of normal pregnancy, unspecified, first trimester: Secondary | ICD-10-CM

## 2015-06-01 DIAGNOSIS — R12 Heartburn: Secondary | ICD-10-CM

## 2015-06-01 HISTORY — DX: Unspecified infectious disease: B99.9

## 2015-06-01 LAB — CBC
HCT: 37.7 % (ref 36.0–46.0)
Hemoglobin: 12.7 g/dL (ref 12.0–15.0)
MCH: 30.3 pg (ref 26.0–34.0)
MCHC: 33.7 g/dL (ref 30.0–36.0)
MCV: 90 fL (ref 78.0–100.0)
PLATELETS: 229 10*3/uL (ref 150–400)
RBC: 4.19 MIL/uL (ref 3.87–5.11)
RDW: 13 % (ref 11.5–15.5)
WBC: 5.2 10*3/uL (ref 4.0–10.5)

## 2015-06-01 LAB — WET PREP, GENITAL
SPERM: NONE SEEN
Trich, Wet Prep: NONE SEEN
YEAST WET PREP: NONE SEEN

## 2015-06-01 LAB — ABO/RH: ABO/RH(D): A POS

## 2015-06-01 LAB — HCG, QUANTITATIVE, PREGNANCY: HCG, BETA CHAIN, QUANT, S: 19549 m[IU]/mL — AB (ref <5)

## 2015-06-01 NOTE — MAU Note (Signed)
Pain in lower abd for ~ 5 days, feels like period is going to start.  Has also had some sharp pains in upper abd.

## 2015-06-01 NOTE — MAU Note (Signed)
Urine sent to lab 

## 2015-06-01 NOTE — Discharge Instructions (Signed)

## 2015-06-01 NOTE — MAU Provider Note (Signed)
  History     CSN: [id number]  Arrival date and time: 06/01/15 1006   First Provider Initiated Contact with Patient 06/01/15 1114      Chief Complaint  Patient presents with  . Abdominal Pain   HPI  25 y/o AA G1P0 female with medical history of migraine with aura, UTI infection presents to MAU for evaluation of abdominal pain and positive pregnancy test. Patient states pain is in epigastric area, started 1 week ago, and comes and goes for about 30 min at a time with no known triggers. States she is "gassy and burps a lot". Food sometimes helps alleviate pain. Also notes some lower abdominal pain that she initially believed was her usual cramping from her menstrual cycle. LMP 04/19/15. Patient went to ED yesterday and had positive pregnancy test and was discharged with instructions to come here. Patient has no other complaints. Denies vaginal bleeding or discharge, fevers, chills, nausea, vomiting, back pain.  . OB History    Gravida Para Term Preterm AB TAB SAB Ectopic Multiple Living   1               Past Medical History  Diagnosis Date  . Headache(784.0)   . Migraine with aura 05/13/2012  . Infection     UTI    Past Surgical History  Procedure Laterality Date  . No past surgeries      Family History  Problem Relation Age of Onset  . Hepatitis C Father   . Heart disease Maternal Grandmother   . Diabetes Maternal Grandmother   . Stroke Maternal Grandfather   . Heart disease Paternal Grandmother   . Stroke Paternal Grandfather     Social History  Substance Use Topics  . Smoking status: Never Smoker   . Smokeless tobacco: Never Used  . Alcohol Use: 1.5 oz/week    3 Standard drinks or equivalent per week     Comment: Occasional, last 4/9    Allergies: No Known Allergies  Prescriptions prior to admission  Medication Sig Dispense Refill Last Dose  . acetaminophen-codeine (TYLENOL #3) 300-30 MG tablet Take 1 tablet by mouth every 6 (six) hours as needed for  moderate pain or severe pain.   05/28/2015  . ibuprofen (ADVIL,MOTRIN) 800 MG tablet Take 800 mg by mouth every 6 (six) hours as needed for moderate pain.   05/31/2015 at Unknown time  . penicillin v potassium (VEETID) 500 MG tablet Take 500 mg by mouth every 6 (six) hours.   05/29/2015    ROS Physical Exam   Blood pressure 118/70, pulse 78, temperature 98.7 F (37.1 C), temperature source Oral, resp. rate 18, height 5\' 1"  (1.549 m), weight 61.417 kg (135 lb 6.4 oz), last menstrual period 04/19/2015.  Physical Exam  MAU Course  Procedures  MDM U/S showed normal intrauterine growth  Assessment and Plan  25 y/o AA G1P0 female with medical history of migraine with aura, UTI infection presents to MAU for evaluation of abdominal pain and positive pregnancy test. Patient had normal U/S that showed normal intrauterine growth. Patient's epigastric pain intermittent and mild according to patient, is receptive to using Zantac. Patient is in agreement with this plan and voices no other concerns at this time. A list of providers were given to patient for prenatal care.   Delancey Moraes H [place] 06/01/2015, 11:28 AM

## 2015-06-01 NOTE — MAU Provider Note (Signed)
Chief Complaint: Abdominal Pain   First Provider Initiated Contact with Patient 06/01/15 1114      SUBJECTIVE HPI: Marie Hampton is a 25 y.o. G1P0 at [redacted]w[redacted]d by LMP who presents to maternity admissions reporting abdominal cramping similar to menstrual cramping with onset 1 week ago.  She also reports upper abdominal/epigastric pain that is worsened after eating.  She was seen in the ED yesterday with positive pregnancy test and was told to come to [facility] if abdominal pain persisted.  The pain is unchanged and is described as mild. She has not tried anything for her pain.  She denies vaginal bleeding, vaginal itching/burning, urinary symptoms, h/a, dizziness, n/v, or fever/chills.     HPI  Past Medical History  Diagnosis Date  . Headache(784.0)   . Migraine with aura 05/13/2012  . Infection     UTI   Past Surgical History  Procedure Laterality Date  . No past surgeries     Social History   Social History  . Marital Status: Single    Spouse Name: N/A  . Number of Children: N/A  . Years of Education: N/A   Occupational History  . Student    Social History Main Topics  . Smoking status: Never Smoker   . Smokeless tobacco: Never Used  . Alcohol Use: 1.5 oz/week    3 Standard drinks or equivalent per week     Comment: Occasional, last 4/9  . 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 Sexual Activity: Not on file   Other Topics Concern  . Not on file   Social History Narrative    Right handed 25 year old AA female is not married and lives at home with her mother, father and sister.  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  She occasionally drinks alcohol and uses recreational drugs socially.   No current facility-administered medications on file prior to encounter.   No current outpatient prescriptions on file prior to encounter.   No Known Allergies  ROS:  Review of Systems  Constitutional: Negative  for fever, chills and fatigue.  Respiratory: Negative for shortness of breath.   Cardiovascular: Negative for chest pain.  Gastrointestinal: Positive for abdominal pain.  Genitourinary: Positive for pelvic pain. Negative for dysuria, flank pain, vaginal bleeding, vaginal discharge, difficulty urinating and vaginal pain.  Neurological: Negative for dizziness and headaches.  Psychiatric/Behavioral: Negative.      I have reviewed patient's Past Medical Hx, Surgical Hx, Family Hx, Social Hx, medications and allergies.   Physical Exam   Patient Vitals for the past 24 hrs:  BP Temp Temp src Pulse Resp Height Weight  06/01/15 1336 117/61 mmHg - - 79 18 - -  06/01/15 1032 118/70 mmHg 98.7 F (37.1 C) Oral 78 18  (1.549 m) 135 lb 6.4 oz (61.417 kg)   Constitutional: Well-developed, well-nourished female in no acute distress.  Cardiovascular: normal rate Respiratory: normal effort GI: Abd soft, non-tender. Pos BS x 4 MS: Extremities nontender, no edema, normal ROM Neurologic: Alert and oriented x 4.  GU: Neg CVAT.  PELVIC EXAM: Cervix pink, visually closed, without lesion, scant white creamy discharge, vaginal walls and external genitalia normal Bimanual exam: Cervix 0/long/high, firm, anterior, neg CMT, uterus nontender, nonenlarged, adnexa without tenderness, enlargement, or mass  LAB RESULTS Results for orders placed or performed during the hospital encounter of 06/01/15 (from the past 24 hour(s))  CBC  Status: None   Collection Time: 06/01/15 11:19 AM  Result Value Ref Range   WBC 5.2 4.0 - 10.5 K/uL   RBC 4.19 3.87 - 5.11 MIL/uL   Hemoglobin 12.7 12.0 - 15.0 g/dL   HCT 40.9 81.1 - 91.4 %   MCV 90.0 78.0 - 100.0 fL   MCH 30.3 26.0 - 34.0 pg   MCHC 33.7 30.0 - 36.0 g/dL   RDW 78.2 95.6 - 21.3 %   Platelets 229 150 - 400 K/uL  hCG, quantitative, pregnancy     Status: Abnormal   Collection Time: 06/01/15 11:19 AM  Result Value Ref Range   hCG, Beta Chain, Quant, S 19549  (H) <5 mIU/mL  ABO/Rh     Status: None   Collection Time: 06/01/15 11:19 AM  Result Value Ref Range   ABO/RH(D) A POS   Wet prep, genital     Status: Abnormal   Collection Time: 06/01/15 11:56 AM  Result Value Ref Range   Yeast Wet Prep HPF POC NONE SEEN NONE SEEN   Trich, Wet Prep NONE SEEN NONE SEEN   Clue Cells Wet Prep HPF POC PRESENT (A) NONE SEEN   WBC, Wet Prep HPF POC MODERATE (A) NONE SEEN   Sperm NONE SEEN     --/--/A POS (04/11 1119)  IMAGING US Ob Comp Less 14 Wks  06/01/2015  CLINICAL DATA:  Pregnant, abdominal pain EXAM: OBSTETRIC <14 WK [place] AND TRANSVAGINAL OB US TECHNIQUE: Both transabdominal and transvaginal ultrasound examinations were performed for complete evaluation of the gestation as well as the maternal uterus, adnexal regions, and pelvic cul-de-sac. Transvaginal technique was performed to assess early pregnancy. COMPARISON:  None. FINDINGS: Intrauterine gestational sac: Single Yolk sac:  Present Embryo:  Present Cardiac Activity: Present Heart Rate: 99  bpm CRL:  5.0  mm   6 w   1 d                  [place] EDC: 01/24/2016 Subchorionic hemorrhage:  None visualized. Maternal uterus/adnexae: Bilateral ovaries are within normal limits, noting a right corpus luteal cyst. No free fluid. IMPRESSION: Single live intrauterine gestation, with estimated gestational age [redacted] weeks 1 day by crown-rump length. Electronically Signed   By: Charline Bills M.D.   On: 06/01/2015 13:32   US Ob Transvaginal  06/01/2015  CLINICAL DATA:  Pregnant, abdominal pain EXAM: OBSTETRIC <14 WK [place] AND TRANSVAGINAL OB US TECHNIQUE: Both transabdominal and transvaginal ultrasound examinations were performed for complete evaluation of the gestation as well as the maternal uterus, adnexal regions, and pelvic cul-de-sac. Transvaginal technique was performed to assess early pregnancy. COMPARISON:  None. FINDINGS: Intrauterine gestational sac: Single Yolk sac:  Present Embryo:  Present Cardiac Activity: Present  Heart Rate: 99  bpm CRL:  5.0  mm   6 w   1 d                  [place] EDC: 01/24/2016 Subchorionic hemorrhage:  None visualized. Maternal uterus/adnexae: Bilateral ovaries are within normal limits, noting a right corpus luteal cyst. No free fluid. IMPRESSION: Single live intrauterine gestation, with estimated gestational age [redacted] weeks 1 day by crown-rump length. Electronically Signed   By: Charline Bills M.D.   On: 06/01/2015 13:32    MAU Management/MDM: Ordered labs and [place] and reviewed results.  No evidence of infection on today's findings, GCC pending.  IUP on [place].  Pt given list of OB providers to follow up.  Return to MAU as needed  for emergencies.  Pt stable at time of discharge.  ASSESSMENT 1. Normal IUP (intrauterine pregnancy) on prenatal ultrasound, first trimester   2. Pelvic pain affecting pregnancy in first trimester, antepartum   3. Heartburn during pregnancy in first trimester     PLAN Discharge home Zantac 150 mg PO BID PRN for heartburn    Medication List    STOP taking these medications        ibuprofen 800 MG tablet  Commonly known as:  ADVIL,MOTRIN      TAKE these medications        acetaminophen-codeine 300-30 MG tablet  Commonly known as:  TYLENOL #3  Take 1 tablet by mouth every 6 (six) hours as needed for moderate pain or severe pain.     penicillin v potassium 500 MG tablet  Commonly known as:  VEETID  Take 500 mg by mouth every 6 (six) hours.       Follow-up Information    Please follow up.   Why:  Start prenatal care as soon as possible, Return to MAU as needed for emergencies      Sharen CounterLisa Leftwich-Kirby Certified Nurse-Midwife 06/01/2015  4:02 PM

## 2015-06-01 NOTE — MAU Note (Signed)
Assumed care of patient.

## 2015-06-02 LAB — HIV ANTIBODY (ROUTINE TESTING W REFLEX): HIV SCREEN 4TH GENERATION: NONREACTIVE

## 2015-06-02 LAB — GC/CHLAMYDIA PROBE AMP (~~LOC~~) NOT AT ARMC
Chlamydia: NEGATIVE
NEISSERIA GONORRHEA: NEGATIVE

## 2015-06-21 DIAGNOSIS — Z3490 Encounter for supervision of normal pregnancy, unspecified, unspecified trimester: Secondary | ICD-10-CM

## 2015-06-21 HISTORY — DX: Encounter for supervision of normal pregnancy, unspecified, unspecified trimester: Z34.90

## 2015-06-21 LAB — OB RESULTS CONSOLE GC/CHLAMYDIA
Chlamydia: NEGATIVE
Gonorrhea: NEGATIVE

## 2015-06-21 LAB — OB RESULTS CONSOLE ABO/RH: RH TYPE: POSITIVE

## 2015-06-21 LAB — OB RESULTS CONSOLE HIV ANTIBODY (ROUTINE TESTING): HIV: NONREACTIVE

## 2015-06-21 LAB — OB RESULTS CONSOLE ANTIBODY SCREEN: Antibody Screen: NEGATIVE

## 2015-06-21 LAB — OB RESULTS CONSOLE HEPATITIS B SURFACE ANTIGEN: HEP B S AG: NEGATIVE

## 2015-06-21 LAB — OB RESULTS CONSOLE RUBELLA ANTIBODY, IGM: Rubella: IMMUNE

## 2015-06-21 LAB — OB RESULTS CONSOLE RPR: RPR: NONREACTIVE

## 2015-12-27 LAB — OB RESULTS CONSOLE GBS: STREP GROUP B AG: NEGATIVE

## 2016-01-18 ENCOUNTER — Encounter (HOSPITAL_COMMUNITY): Payer: Self-pay

## 2016-01-18 ENCOUNTER — Inpatient Hospital Stay (EMERGENCY_DEPARTMENT_HOSPITAL)
Admission: AD | Admit: 2016-01-18 | Discharge: 2016-01-18 | Disposition: A | Discharge status: Home / Self Care | Payer: Medicaid Other | Source: Ambulatory Visit | Attending: Obstetrics and Gynecology | Admitting: Obstetrics and Gynecology

## 2016-01-18 ENCOUNTER — Encounter (HOSPITAL_COMMUNITY): Payer: Self-pay | Admitting: *Deleted

## 2016-01-18 ENCOUNTER — Inpatient Hospital Stay (HOSPITAL_COMMUNITY)
Admission: AD | Admit: 2016-01-18 | Discharge: 2016-01-22 | DRG: 775 | Disposition: A | Discharge status: Home / Self Care | Payer: Medicaid Other | Source: Ambulatory Visit | Attending: Obstetrics and Gynecology | Admitting: Obstetrics and Gynecology

## 2016-01-18 ENCOUNTER — Inpatient Hospital Stay (HOSPITAL_COMMUNITY): Payer: Medicaid Other

## 2016-01-18 DIAGNOSIS — Z8249 Family history of ischemic heart disease and other diseases of the circulatory system: Secondary | ICD-10-CM

## 2016-01-18 DIAGNOSIS — O99324 Drug use complicating childbirth: Secondary | ICD-10-CM | POA: Diagnosis present

## 2016-01-18 DIAGNOSIS — F1290 Cannabis use, unspecified, uncomplicated: Secondary | ICD-10-CM | POA: Diagnosis present

## 2016-01-18 DIAGNOSIS — O479 False labor, unspecified: Secondary | ICD-10-CM

## 2016-01-18 DIAGNOSIS — Z833 Family history of diabetes mellitus: Secondary | ICD-10-CM

## 2016-01-18 DIAGNOSIS — Z3A39 39 weeks gestation of pregnancy: Secondary | ICD-10-CM | POA: Insufficient documentation

## 2016-01-18 DIAGNOSIS — J45909 Unspecified asthma, uncomplicated: Secondary | ICD-10-CM | POA: Diagnosis present

## 2016-01-18 DIAGNOSIS — O368390 Maternal care for abnormalities of the fetal heart rate or rhythm, unspecified trimester, not applicable or unspecified: Secondary | ICD-10-CM

## 2016-01-18 DIAGNOSIS — Z823 Family history of stroke: Secondary | ICD-10-CM

## 2016-01-18 NOTE — MAU Note (Signed)
[facility]

## 2016-01-18 NOTE — MAU Provider Note (Signed)
Chief Complaint:  Non-stress Test   First Provider Initiated Contact with Patient 01/18/16 1426     HPI: Marie Hampton is a 25 y.o. G1P0 at 4811w1d who presents to maternity admissions reporting nonreactive NST in office today.  Was sent here for monitoring.  Is having moderately painful contractions She reports good fetal movement, denies LOF, vaginal bleeding, vaginal itching/burning, urinary symptoms, h/a, dizziness, n/v, diarrhea, constipation or fever/chills.  She denies headache, visual changes or RUQ abdominal pain.  Other  This is a new (Nonreactive NST) problem. The current episode started today. Associated symptoms include abdominal pain. Pertinent negatives include no chills, fever, nausea or vomiting. Nothing aggravates the symptoms. She has tried nothing for the symptoms.   RN Note: Sent from the office for Nonreactive NST  Past Medical History: Past Medical History:  Diagnosis Date  . Headache(784.0)   . Infection    UTI  . Migraine with aura 05/13/2012    Past obstetric history: OB History  Gravida Para Term Preterm AB Living  1            SAB TAB Ectopic Multiple Live Births               # Outcome Date GA Lbr Len/2nd Weight Sex Delivery Anes PTL Lv  1 Current               Past Surgical History: Past Surgical History:  Procedure Laterality Date  . NO PAST SURGERIES      Family History: Family History  Problem Relation Age of Onset  . Hepatitis C Father   . Heart disease Maternal Grandmother   . Diabetes Maternal Grandmother   . Stroke Maternal Grandfather   . Heart disease Paternal Grandmother   . Stroke Paternal Grandfather     Social History: Social History  Substance Use Topics  . Smoking status: Never Smoker  . Smokeless tobacco: Never Used  . Alcohol use 1.5 oz/week    3 Standard drinks or equivalent per week     Comment: Occasional, last 4/9    Allergies: No Known Allergies  Meds:  Prescriptions Prior to Admission  Medication Sig  Dispense Refill Last Dose  . acetaminophen-codeine (TYLENOL #3) 300-30 MG tablet Take 1 tablet by mouth every 6 (six) hours as needed for moderate pain or severe pain.   05/28/2015    I have reviewed patient's Past Medical Hx, Surgical Hx, Family Hx, Social Hx, medications and allergies.   ROS:  Review of Systems  Constitutional: Negative for chills and fever.  Respiratory: Negative for shortness of breath.   Gastrointestinal: Positive for abdominal pain. Negative for constipation, diarrhea, nausea and vomiting.  Genitourinary: Positive for pelvic pain. Negative for dysuria and vaginal bleeding.  Musculoskeletal: Negative for back pain.   Other systems negative  Physical Exam  Patient Vitals for the past 24 hrs:  Temp Resp  01/18/16 1406 98 F (36.7 C) 16   Constitutional: Well-developed, well-nourished female in no acute distress.  Cardiovascular: normal rate and rhythm Respiratory: normal effort, clear to auscultation bilaterally GI: Abd soft, non-tender, gravid appropriate for gestational age.   No rebound or guarding. MS: Extremities nontender, no edema, normal ROM Neurologic: Alert and oriented x 4.  GU: Neg CVAT.  Dilation: 1 Effacement (%): 90 Cervical Position: Anterior Station: -2 Presentation: Vertex Exam by:: Merced Hanners CNM  Cervix was 1cm/50% in office  FHT:  Baseline 135 , moderate variability, accelerations present, no decelerations Contractions: q 3-5 mins Irregular  Labs: No results found for this or any previous visit (from the past 24 hour(s)). --/--/A POS (04/11 1119)  Imaging:  BPP score 8/8  MAU Course/MDM: I have ordered labs and reviewed results. NST reviewed Consult Dr Su Hiltoberts with presentation, exam findings and test results. She ordered a BPP.which was normal 8/8 Treatments in MAU included NST, BPP.   Follow up NST after BPP was again reactive We rechecked cervix again and it was still loose 1/90/-2/vertex  Assessment: SIUP at  6559w1d Nonreactive NST in office Reactive NST here BPP 8/8 Uterine contractions, might be latent phase of labor  Plan: Discharge home Labor precautions and fetal kick counts Follow up in Office for prenatal visits and recheck of cervix  Encouraged to return here or to other Urgent Care/ED if she develops worsening of symptoms, increase in pain, fever, or other concerning symptoms.   Pt stable at time of discharge.  Wynelle BourgeoisMarie Seraphim Trow CNM, MSN Certified Nurse-Midwife 01/18/2016 2:26 PM

## 2016-01-18 NOTE — MAU Note (Signed)
PT  SAYS  SHE  STARTED  HURTING    STRONG   WITH  UC.     WAS HERE EARLIER-    SENT  HOME.   VE   1  CM.    DENIES HSV AND  MRSA. GBS- NEG

## 2016-01-18 NOTE — Discharge Instructions (Signed)
Braxton Hicks Contractions °Contractions of the uterus can occur throughout pregnancy. Contractions are not always a sign that you are in labor.  °WHAT ARE BRAXTON HICKS CONTRACTIONS?  °Contractions that occur before labor are called Braxton Hicks contractions, or false labor. Toward the end of pregnancy (32-34 weeks), these contractions can develop more often and may become more forceful. This is not true labor because these contractions do not result in opening (dilatation) and thinning of the cervix. They are sometimes difficult to tell apart from true labor because these contractions can be forceful and people have different pain tolerances. You should not feel embarrassed if you go to the hospital with false labor. Sometimes, the only way to tell if you are in true labor is for your health care provider to look for changes in the cervix. °If there are no prenatal problems or other health problems associated with the pregnancy, it is completely safe to be sent home with false labor and await the onset of true labor. °HOW CAN YOU TELL THE DIFFERENCE BETWEEN TRUE AND FALSE LABOR? °False Labor  °· The contractions of false labor are usually shorter and not as hard as those of true labor.   °· The contractions are usually irregular.   °· The contractions are often felt in the front of the lower abdomen and in the groin.   °· The contractions may go away when you walk around or change positions while lying down.   °· The contractions get weaker and are shorter lasting as time goes on.   °· The contractions do not usually become progressively stronger, regular, and closer together as with true labor.   °True Labor  °· Contractions in true labor last 30-70 seconds, become very regular, usually become more intense, and increase in frequency.   °· The contractions do not go away with walking.   °· The discomfort is usually felt in the top of the uterus and spreads to the lower abdomen and low back.   °· True labor can be  determined by your health care provider with an exam. This will show that the cervix is dilating and getting thinner.   °WHAT TO REMEMBER °· Keep up with your usual exercises and follow other instructions given by your health care provider.   °· Take medicines as directed by your health care provider.   °· Keep your regular prenatal appointments.   °· Eat and drink lightly if you think you are going into labor.   °· If Braxton Hicks contractions are making you uncomfortable:   °¨ Change your position from lying down or resting to walking, or from walking to resting.   °¨ Sit and rest in a tub of warm water.   °¨ Drink 2-3 glasses of water. Dehydration may cause these contractions.   °¨ Do slow and deep breathing several times an hour.   °WHEN SHOULD I SEEK IMMEDIATE MEDICAL CARE? °Seek immediate medical care if: °· Your contractions become stronger, more regular, and closer together.   °· You have fluid leaking or gushing from your vagina.   °· You have a fever.   °· You pass blood-tinged mucus.   °· You have vaginal bleeding.   °· You have continuous abdominal pain.   °· You have low back pain that you never had before.   °· You feel your baby's head pushing down and causing pelvic pressure.   °· Your baby is not moving as much as it used to.   °This information is not intended to replace advice given to you by your health care provider. Make sure you discuss any questions you have with your health care   provider. °Document Released: 02/06/2005 Document Revised: 05/31/2015 Document Reviewed: 11/18/2012 °Elsevier Interactive Patient Education © 2017 [employer] Inc. ° °

## 2016-01-19 ENCOUNTER — Inpatient Hospital Stay (HOSPITAL_COMMUNITY): Payer: Medicaid Other | Admitting: Anesthesiology

## 2016-01-19 ENCOUNTER — Encounter (HOSPITAL_COMMUNITY): Payer: Self-pay | Admitting: *Deleted

## 2016-01-19 DIAGNOSIS — J45909 Unspecified asthma, uncomplicated: Secondary | ICD-10-CM | POA: Diagnosis present

## 2016-01-19 DIAGNOSIS — F1290 Cannabis use, unspecified, uncomplicated: Secondary | ICD-10-CM | POA: Diagnosis present

## 2016-01-19 DIAGNOSIS — Z8249 Family history of ischemic heart disease and other diseases of the circulatory system: Secondary | ICD-10-CM | POA: Diagnosis not present

## 2016-01-19 DIAGNOSIS — Z823 Family history of stroke: Secondary | ICD-10-CM | POA: Diagnosis not present

## 2016-01-19 DIAGNOSIS — O99324 Drug use complicating childbirth: Secondary | ICD-10-CM | POA: Diagnosis present

## 2016-01-19 DIAGNOSIS — Z3493 Encounter for supervision of normal pregnancy, unspecified, third trimester: Secondary | ICD-10-CM | POA: Diagnosis present

## 2016-01-19 DIAGNOSIS — Z833 Family history of diabetes mellitus: Secondary | ICD-10-CM | POA: Diagnosis not present

## 2016-01-19 DIAGNOSIS — Z3A39 39 weeks gestation of pregnancy: Secondary | ICD-10-CM | POA: Diagnosis not present

## 2016-01-19 LAB — CBC
HEMATOCRIT: 40.7 % (ref 36.0–46.0)
HEMOGLOBIN: 14.3 g/dL (ref 12.0–15.0)
MCH: 30 pg (ref 26.0–34.0)
MCHC: 35.1 g/dL (ref 30.0–36.0)
MCV: 85.3 fL (ref 78.0–100.0)
Platelets: 227 10*3/uL (ref 150–400)
RBC: 4.77 MIL/uL (ref 3.87–5.11)
RDW: 14.9 % (ref 11.5–15.5)
WBC: 13.5 10*3/uL — ABNORMAL HIGH (ref 4.0–10.5)

## 2016-01-19 LAB — TYPE AND SCREEN
ABO/RH(D): A POS
ANTIBODY SCREEN: NEGATIVE

## 2016-01-19 MED ORDER — FENTANYL 2.5 MCG/ML BUPIVACAINE 1/10 % EPIDURAL INFUSION (WH - ANES)
14.0000 mL/h | INTRAMUSCULAR | Status: DC | PRN
  Administered 2016-01-19 (×4): 14 mL/h via EPIDURAL
  Filled 2016-01-19 (×2): qty 100

## 2016-01-19 MED ORDER — ONDANSETRON HCL 4 MG/2ML IJ SOLN: 4.0000 mg | Freq: Four times a day (QID) | INTRAMUSCULAR | Status: DC | PRN

## 2016-01-19 MED ORDER — LACTATED RINGERS IV SOLN
500.0000 mL | Freq: Once | INTRAVENOUS | Status: AC
  Administered 2016-01-19: 500 mL via INTRAVENOUS

## 2016-01-19 MED ORDER — SOD CITRATE-CITRIC ACID 500-334 MG/5ML PO SOLN: 30.0000 mL | ORAL | Status: DC | PRN

## 2016-01-19 MED ORDER — OXYTOCIN 40 UNITS IN LACTATED RINGERS INFUSION - SIMPLE MED
2.5000 [IU]/h | INTRAVENOUS | Status: DC
  Filled 2016-01-19: qty 1000

## 2016-01-19 MED ORDER — DIPHENHYDRAMINE HCL 50 MG/ML IJ SOLN: 12.5000 mg | INTRAMUSCULAR | Status: DC | PRN

## 2016-01-19 MED ORDER — TERBUTALINE SULFATE 1 MG/ML IJ SOLN
0.2500 mg | Freq: Once | INTRAMUSCULAR | Status: DC | PRN
  Filled 2016-01-19: qty 1

## 2016-01-19 MED ORDER — EPHEDRINE 5 MG/ML INJ
10.0000 mg | INTRAVENOUS | Status: DC | PRN
  Filled 2016-01-19: qty 4

## 2016-01-19 MED ORDER — LIDOCAINE HCL (PF) 1 % IJ SOLN
30.0000 mL | INTRAMUSCULAR | Status: AC | PRN
  Administered 2016-01-20: 30 mL via SUBCUTANEOUS
  Filled 2016-01-19: qty 30

## 2016-01-19 MED ORDER — PHENYLEPHRINE 40 MCG/ML (10ML) SYRINGE FOR IV PUSH (FOR BLOOD PRESSURE SUPPORT)
PREFILLED_SYRINGE | INTRAVENOUS | Status: AC
  Filled 2016-01-19: qty 20

## 2016-01-19 MED ORDER — FENTANYL 2.5 MCG/ML BUPIVACAINE 1/10 % EPIDURAL INFUSION (WH - ANES)
INTRAMUSCULAR | Status: AC
  Filled 2016-01-19: qty 100

## 2016-01-19 MED ORDER — OXYTOCIN 40 UNITS IN LACTATED RINGERS INFUSION - SIMPLE MED
1.0000 m[IU]/min | INTRAVENOUS | Status: DC
  Administered 2016-01-19: 2 m[IU]/min via INTRAVENOUS

## 2016-01-19 MED ORDER — FENTANYL CITRATE (PF) 100 MCG/2ML IJ SOLN
50.0000 ug | INTRAMUSCULAR | Status: DC | PRN
  Administered 2016-01-19: 50 ug via INTRAVENOUS
  Filled 2016-01-19 (×2): qty 2

## 2016-01-19 MED ORDER — OXYCODONE-ACETAMINOPHEN 5-325 MG PO TABS: 1.0000 | ORAL_TABLET | ORAL | Status: DC | PRN

## 2016-01-19 MED ORDER — PHENYLEPHRINE 40 MCG/ML (10ML) SYRINGE FOR IV PUSH (FOR BLOOD PRESSURE SUPPORT)
80.0000 ug | PREFILLED_SYRINGE | INTRAVENOUS | Status: DC | PRN
  Filled 2016-01-19: qty 5

## 2016-01-19 MED ORDER — LACTATED RINGERS IV SOLN
500.0000 mL | INTRAVENOUS | Status: DC | PRN
  Administered 2016-01-19 – 2016-01-20 (×2): 250 mL via INTRAVENOUS

## 2016-01-19 MED ORDER — FLEET ENEMA 7-19 GM/118ML RE ENEM: 1.0000 | ENEMA | RECTAL | Status: DC | PRN

## 2016-01-19 MED ORDER — LIDOCAINE HCL (PF) 1 % IJ SOLN
INTRAMUSCULAR | Status: DC | PRN
  Administered 2016-01-19: 5 mL via EPIDURAL
  Administered 2016-01-19: 2 mL via EPIDURAL
  Administered 2016-01-19: 3 mL via EPIDURAL

## 2016-01-19 MED ORDER — ACETAMINOPHEN 325 MG PO TABS: 650.0000 mg | ORAL_TABLET | ORAL | Status: DC | PRN

## 2016-01-19 MED ORDER — LACTATED RINGERS IV SOLN
INTRAVENOUS | Status: DC
  Administered 2016-01-19 – 2016-01-20 (×5): via INTRAVENOUS

## 2016-01-19 MED ORDER — LACTATED RINGERS IV SOLN: 500.0000 mL | Freq: Once | INTRAVENOUS | Status: DC

## 2016-01-19 MED ORDER — OXYTOCIN BOLUS FROM INFUSION
500.0000 mL | Freq: Once | INTRAVENOUS | Status: AC
  Administered 2016-01-20: 500 mL via INTRAVENOUS

## 2016-01-19 MED ORDER — OXYCODONE-ACETAMINOPHEN 5-325 MG PO TABS: 2.0000 | ORAL_TABLET | ORAL | Status: DC | PRN

## 2016-01-19 NOTE — Progress Notes (Signed)
Lauro FranklinZakia A Fesperman is a 25 y.o. G1P0 at 1719w2d admitted for active labor  Subjective:  Comfortable with epidural.  Objective:  BP 112/80   Pulse (!) 104   Temp 98.5 F (36.9 C) (Oral)   Resp 18   Ht 5' (1.524 m)   Wt 173 lb (78.5 kg)   LMP 04/19/2015   SpO2 100%   BMI 33.79 kg/m  No intake/output data recorded. Total I/O In: -  Out: 1000 [Urine:1000]  FHT:  Cat: 1 UC:   regular, every 4 minutes SVE:   Dilation: 8 Effacement (%): 100 Station: +1 Exam by:: Dr. Stefano GaulStringer  Labs:  Lab Results  Component Value Date   WBC 13.5 (H) 01/19/2016   HGB 14.3 01/19/2016   HCT 40.7 01/19/2016   MCV 85.3 01/19/2016   PLT 227 01/19/2016    Assessment / Plan:  Protracted active phase  Labor: Progressing normally Anticipated MOD:  NSVD  Kadence Mikkelson V 01/19/2016, 6:47 PM

## 2016-01-19 NOTE — Progress Notes (Signed)
Marie Hampton is a 25 y.o. G1P0 at 4932w2d admitted for active labor  Subjective:  Doing OK.  Objective:  BP 125/64   Pulse (!) 110   Temp 99 F (37.2 C) (Oral)   Resp 18   Ht 5' (1.524 m)   Wt 173 lb (78.5 kg)   LMP 04/19/2015   SpO2 98%   BMI 33.79 kg/m  No intake/output data recorded. No intake/output data recorded.  FHT:  Cat: 1 UC:   regular SVE:   Dilation: 5 Effacement (%): 90 Station: 0 Exam by:: [name], CNM   Labs:  Lab Results  Component Value Date   WBC 13.5 (H) 01/19/2016   HGB 14.3 01/19/2016   HCT 40.7 01/19/2016   MCV 85.3 01/19/2016   PLT 227 01/19/2016    Assessment / Plan:  Spontaneous labor, progressing normally  Labor: Progressing normally Anticipated MOD:  NSVD  Marie Hampton V 01/19/2016, 7:43 AM

## 2016-01-19 NOTE — Progress Notes (Addendum)
Marie FranklinZakia A Hampton MRN: [id number]  Subjective: -Patient on hands and knees doing modified pelvic thrusts.  Reports pain in buttocks and intermittent pressure with contractions.    Objective: BP (!) 144/68   Pulse (!) 110   Temp 98.8 F (37.1 C) (Oral)   Resp 20   Ht 5' (1.524 m)   Wt 78.5 kg (173 lb)   LMP 04/19/2015   SpO2 100%   BMI 33.79 kg/m  I/O last 3 completed shifts: In: -  Out: 1000 [Urine:1000] No intake/output data recorded.  Fetal Monitoring: FHT: 150 bpm, Mod Var, +Early Decels, +10x10 Accels UC: palpates mild to moderate    Vaginal Exam: SVE:   Dilation: 8 Effacement (%): 90 Station: +1 Exam by:: Sabas SousJ. Cassi Jenne, CNM Membranes:SROM x 9hrs Internal Monitors: IUPC inserted  Augmentation/Induction: Pitocin:Initiated Cytotec: None  Assessment:  IUP at 39.2wks Cat I FT  Protracted Labor Labor Augmentation  Plan: -Discussed IUPC r/b, prior to insertion, including increased risk of infection and ability to adequately monitor quantity and strength of contractions. -Discussed initiation of pitocin in context of protracted labor d/t inadequate ctx strength, reassurances given as patient seems discouraged -Discussed need to report constant rectal pressure that increases in intensity with contractions -Patient encouraged to rest to prepare for 2nd stage of labor -Nurse to notify provider prior to increasing pitocin to 467mUn/min -Continue other mgmt as ordered  Valma CavaJessica L Sarin [place],MSN, CNM 01/19/2016, 9:24 PM  Addendum(2153) UC: Q3-474min, MVUs 100mmHg

## 2016-01-19 NOTE — Anesthesia Procedure Notes (Signed)
Epidural Patient location during procedure: OB  Staffing Anesthesiologist: TURK, STEPHEN EDWARD Performed: anesthesiologist   Preanesthetic Checklist Completed: patient identified, pre-op evaluation, timeout performed, IV checked, risks and benefits discussed and monitors and equipment checked  Epidural Patient position: sitting Prep: DuraPrep Patient monitoring: blood pressure and continuous pulse ox Approach: midline Location: L3-L4 Injection technique: LOR air  Needle:  Needle type: Tuohy  Needle gauge: 17 G Needle length: 9 cm Needle insertion depth: 5 cm Catheter size: 19 Gauge Catheter at skin depth: 10 cm Test dose: negative and Other (1% Lidocaine)  Additional Notes Patient identified.  Risk benefits discussed including failed block, incomplete pain control, headache, nerve damage, paralysis, blood pressure changes, nausea, vomiting, reactions to medication both toxic or allergic, and postpartum back pain.  Patient expressed understanding and wished to proceed.  All questions were answered.  Sterile technique used throughout procedure and epidural site dressed with sterile barrier dressing. No paresthesia or other complications noted. The patient did not experience any signs of intravascular injection such as tinnitus or metallic taste in mouth nor signs of intrathecal spread such as rapid motor block. Please see nursing notes for vital signs. Reason for block:procedure for pain     

## 2016-01-19 NOTE — Progress Notes (Signed)
Marie Hampton is a 25 y.o. G1P0 at 821w2d admitted for active labor. Subjective:  Contractions not as strong.  Objective:  BP 123/73   Pulse 92   Temp 97.9 F (36.6 C) (Oral)   Resp 18   Ht 5' (1.524 m)   Wt 173 lb (78.5 kg)   LMP 04/19/2015   SpO2 100%   BMI 33.79 kg/m  No intake/output data recorded. No intake/output data recorded.  FHT:  Cat: 1 (deceleration noted that resolved after position change) UC:   Moderate SVE:   Dilation: 5 Effacement (%): 80 Station: 0, +1 Exam by:: Dr. Stefano GaulStringer  Membranes ruptured. Fluid clear.  Labs:  Lab Results  Component Value Date   WBC 13.5 (H) 01/19/2016   HGB 14.3 01/19/2016   HCT 40.7 01/19/2016   MCV 85.3 01/19/2016   PLT 227 01/19/2016    Assessment / Plan:  Protracted active phase  Labor: I will recommend Pitocin if the patient does not actively change her cervix. Anticipated MOD:  NSVD  Naman Spychalski V 01/19/2016, 12:39 PM

## 2016-01-19 NOTE — H&P (Signed)
Marie Hampton is a 25 y.o. female, G1P0 at 39.2 weeks, presenting for contractions.  Patient Active Problem List   Diagnosis Date Noted  . Indication for care in labor or delivery 01/19/2016    Priority: Medium  . Migraine with aura 05/13/2012  . Migraine with aura, with intractable migraine, so stated, with status migrainosus 04/19/2012    History of present pregnancy: Patient entered care at 9 weeks.   EDC of 01/24/2016 was established by LMP.   Anatomy scan:  19.2 weeks, with normal findings and an fundal placenta.   Additional US evaluations:  23.2 IUP completion of anatomy scan Significant prenatal events:  None   Last evaluation:  01/18/2016 MAU  OB History    Gravida Para Term Preterm AB Living   1             SAB TAB Ectopic Multiple Live Births                 Past Medical History:  Diagnosis Date  . Headache(784.0)   . Infection    UTI  . Migraine with aura 05/13/2012   Past Surgical History:  Procedure Laterality Date  . NO PAST SURGERIES     Family History: family history includes Diabetes in her maternal grandmother; Heart disease in her maternal grandmother and paternal grandmother; Hepatitis C in her father; Stroke in her maternal grandfather and paternal grandfather. Social History:  reports that she has never smoked. She has never used smokeless tobacco. She reports that she drinks about 1.5 oz of alcohol per week . She reports that she uses drugs, including Marijuana, about 7 times per week.   Prenatal Transfer Tool  Maternal Diabetes: No Genetic Screening: Normal Maternal Ultrasounds/Referrals: Declined Fetal Ultrasounds or other Referrals:  None Maternal Substance Abuse:  MJ use Significant Maternal Medications:  None Significant Maternal Lab Results: None  TDAP yes Flu no  ROS:  All 10 systems reviewed and negative expect as stated above  No Known Allergies   Dilation: 4 Effacement (%): 90 Station: -2 Exam by:: Kayren EavesAshley Garvey RN  Blood  pressure 126/72, pulse 110, temperature 98.8 F (37.1 C), temperature source Oral, resp. rate 18, height 5' (1.524 m), weight 78.5 kg (173 lb), last menstrual period 04/19/2015.  Chest clear Heart RRR without murmur Abd gravid, NT, FH appropriate Pelvic: per RN Ext: +2/+2 reflexes  FHR: Category 1 UCs:  3 in 10 minutes  Prenatal labs: ABO, Rh: --/--/A POS (11/29 0222) Antibody: NEG (11/29 0222) Rubella:  Immune RPR: Nonreactive (05/01 0000)  HBsAg: Negative (05/01 0000)  HIV: Non-reactive (05/01 0000)  GBS: Negative (11/06 0000) Sickle cell/Hgb electrophoresis:  Negative Pap:  neg GC:  Neg Chlamydia:  Neg Genetic screenings:  Negative Glucola:  64 Other:   Hgb 14.1 at NOB, 12.3 at 28 weeks       Assessment/Plan: IUP at 39.2 IUP  Plan: Admit to Birthing Suite per consult with Richardson Doppole Routine [facility] orders Pain med/epidural prn   Henderson NewcomerNancy Jean ProtheroCNM, MSN 01/19/2016, 4:08 AM

## 2016-01-19 NOTE — Progress Notes (Signed)
Subjective: Pt is breathing with contractions. Using coping techniques of shower ball.  Request pain medication. Has family support in room   Objective: BP 139/82   Pulse 92   Temp 98.8 F (37.1 C) (Oral)   Resp 18   Ht 5' (1.524 m)   Wt 78.5 kg (173 lb)   LMP 04/19/2015   BMI 33.79 kg/m  No intake/output data recorded. No intake/output data recorded.  FHT: Category 1 UC:   regular, every 3 minutes SVE:   5/90 0 BBOW    Assessment:  Pt is a G1P0 with EDC of 01/24/2016 39.2 IUP active labor Cat 1 strip  Plan: Monitor progress Anticipate NSVD Kenney HousemanNancy Jean Marsena Taff CNM, MSN 01/19/2016, 5:57 AM

## 2016-01-19 NOTE — Progress Notes (Signed)
Marie Hampton MRN: [id number]  Subjective: -Care assumed of 25 y.o. G1P0 at 6670w2d who presents for active labor. Pregnancy history unremarkable and patient with medical history of asthma.  In room to meet acquaintance of patient and family.  Informed that only 3 people in room for delivery.  Discussed POC to include VE at 2100 with initiation of pitocin if necessary.  Patient reports no desire for pitocin, stating it increases risk of c/s.  States she is doing pelvic thrusts to help with fetal descent.   Objective: BP 132/82   Pulse (!) 105   Temp 98.8 F (37.1 C) (Oral)   Resp 20   Ht 5' (1.524 m)   Wt 78.5 kg (173 lb)   LMP 04/19/2015   SpO2 100%   BMI 33.79 kg/m  I/O last 3 completed shifts: In: -  Out: 1000 [Urine:1000] No intake/output data recorded.  Fetal Monitoring: FHT: 150 bpm, Mod Var, +Early Decels, -Accels UC: Irritability and occasional ctx graphed, palpates moderate    Vaginal Exam: SVE:   Dilation: 8 Effacement (%): 100 Station: +1 Exam by:: Dr. Stefano GaulStringer at [phone] Membranes:SROM x 8hrs Internal Monitors: None  Augmentation/Induction: Pitocin:None Cytotec: None  Assessment:  IUP at 39.2wks Cat I FT  Protracted Labor  Plan: -Will reassess cervical exam at 2100 and start pitocin if indicated -Discussed r/b of pitocin including fetal intolerance, tachysystole, as well as stronger contractions and decreased labor time -Adjust toco to attempt to monitor contractions more accurately  -Continue other mgmt as ordered  Marie CavaJessica L Loleta Frommelt,MSN, CNM 01/19/2016, 8:09 PM

## 2016-01-19 NOTE — Anesthesia Pain Management Evaluation Note (Signed)
  CRNA Pain Management Visit Note  Patient: Marie Hampton, 25 y.o., female  "Hello I am a member of the anesthesia team at [facility]. We have an anesthesia team available at all times to provide care throughout the hospital, including epidural management and anesthesia for C-section. I don't know your plan for the delivery whether it a natural birth, water birth, IV sedation, nitrous supplementation, doula or epidural, but we want to meet your pain goals."   1.Was your pain managed to your expectations on prior hospitalizations?   No prior hospitalizations  2.What is your expectation for pain management during this hospitalization?     Epidural  3.How can we help you reach that goal?   Record the patient's initial score and the patient's pain goal.   Pain: 0  Pain Goal: 7 [facility] wants you to be able to say your pain was always managed very well.  Laban EmperorMalinova,Adria Costley Hristova 01/19/2016

## 2016-01-19 NOTE — MAU Note (Signed)
Notified provider that patient is here for a labor eval. Patient was 3/90/-2 and unchanged after an hour. Patient is having some bloody show. Patient was here earlier for decreased fetal movement. Provider said to recheck patient in another hour.

## 2016-01-19 NOTE — Anesthesia Preprocedure Evaluation (Signed)
Anesthesia Evaluation  Patient identified by MRN, date of birth, ID band Patient awake    Reviewed: Allergy & Precautions, NPO status , Patient's Chart, lab work & pertinent test results  Airway Mallampati: II  TM Distance: >3 FB Neck ROM: Full    Dental  (+) Teeth Intact, Dental Advisory Given   Pulmonary neg pulmonary ROS,    Pulmonary exam normal breath sounds clear to auscultation       Cardiovascular Exercise Tolerance: Good negative cardio ROS Normal cardiovascular exam Rhythm:Regular Rate:Normal     Neuro/Psych  Headaches,    GI/Hepatic negative GI ROS, Neg liver ROS,   Endo/Other  Obesity   Renal/GU negative Renal ROS     Musculoskeletal negative musculoskeletal ROS (+)   Abdominal   Peds  Hematology negative hematology ROS (+) Plt 227k   Anesthesia Other Findings Day of surgery medications reviewed with the patient.  Reproductive/Obstetrics (+) Pregnancy                             Anesthesia Physical Anesthesia Plan  ASA: II  Anesthesia Plan: Epidural   Post-op Pain Management:    Induction:   Airway Management Planned:   Additional Equipment:   Intra-op Plan:   Post-operative Plan:   Informed Consent: I have reviewed the patients History and Physical, chart, labs and discussed the procedure including the risks, benefits and alternatives for the proposed anesthesia with the patient or authorized representative who has indicated his/her understanding and acceptance.   Dental advisory given  Plan Discussed with:   Anesthesia Plan Comments: (Patient identified. Risks/Benefits/Options discussed with patient including but not limited to bleeding, infection, nerve damage, paralysis, failed block, incomplete pain control, headache, blood pressure changes, nausea, vomiting, reactions to medication both or allergic, itching and postpartum back pain. Confirmed with bedside  nurse the patient's most recent platelet count. Confirmed with patient that they are not currently taking any anticoagulation, have any bleeding history or any family history of bleeding disorders. Patient expressed understanding and wished to proceed. All questions were answered. )        Anesthesia Quick Evaluation

## 2016-01-20 ENCOUNTER — Encounter (HOSPITAL_COMMUNITY): Payer: Self-pay

## 2016-01-20 LAB — CBC
HEMATOCRIT: 35.6 % — AB (ref 36.0–46.0)
Hemoglobin: 12.2 g/dL (ref 12.0–15.0)
MCH: 29.6 pg (ref 26.0–34.0)
MCHC: 34.3 g/dL (ref 30.0–36.0)
MCV: 86.4 fL (ref 78.0–100.0)
PLATELETS: 182 10*3/uL (ref 150–400)
RBC: 4.12 MIL/uL (ref 3.87–5.11)
RDW: 15.1 % (ref 11.5–15.5)
WBC: 22.6 10*3/uL — ABNORMAL HIGH (ref 4.0–10.5)

## 2016-01-20 LAB — RPR: RPR Ser Ql: NONREACTIVE

## 2016-01-20 MED ORDER — SODIUM CHLORIDE 0.9 % IV SOLN
3.0000 g | Freq: Four times a day (QID) | INTRAVENOUS | Status: AC
  Administered 2016-01-20 (×4): 3 g via INTRAVENOUS
  Filled 2016-01-20 (×4): qty 3

## 2016-01-20 MED ORDER — SENNOSIDES-DOCUSATE SODIUM 8.6-50 MG PO TABS
2.0000 | ORAL_TABLET | ORAL | Status: DC
  Administered 2016-01-21: 2 via ORAL
  Filled 2016-01-20 (×2): qty 2

## 2016-01-20 MED ORDER — PRENATAL MULTIVITAMIN CH
1.0000 | ORAL_TABLET | Freq: Every day | ORAL | Status: DC
  Administered 2016-01-20 – 2016-01-21 (×2): 1 via ORAL
  Filled 2016-01-20 (×2): qty 1

## 2016-01-20 MED ORDER — ONDANSETRON HCL 4 MG/2ML IJ SOLN: 4.0000 mg | INTRAMUSCULAR | Status: DC | PRN

## 2016-01-20 MED ORDER — DIBUCAINE 1 % RE OINT: 1.0000 "application " | TOPICAL_OINTMENT | RECTAL | Status: DC | PRN

## 2016-01-20 MED ORDER — SIMETHICONE 80 MG PO CHEW: 80.0000 mg | CHEWABLE_TABLET | ORAL | Status: DC | PRN

## 2016-01-20 MED ORDER — COCONUT OIL OIL
1.0000 "application " | TOPICAL_OIL | Status: DC | PRN
  Administered 2016-01-22: 1 via TOPICAL
  Filled 2016-01-20: qty 120

## 2016-01-20 MED ORDER — ONDANSETRON HCL 4 MG PO TABS: 4.0000 mg | ORAL_TABLET | ORAL | Status: DC | PRN

## 2016-01-20 MED ORDER — BENZOCAINE-MENTHOL 20-0.5 % EX AERO
1.0000 "application " | INHALATION_SPRAY | CUTANEOUS | Status: DC | PRN
  Administered 2016-01-20: 1 via TOPICAL
  Filled 2016-01-20: qty 56

## 2016-01-20 MED ORDER — TETANUS-DIPHTH-ACELL PERTUSSIS 5-2.5-18.5 LF-MCG/0.5 IM SUSP: 0.5000 mL | Freq: Once | INTRAMUSCULAR | Status: DC

## 2016-01-20 MED ORDER — ACETAMINOPHEN 325 MG PO TABS: 650.0000 mg | ORAL_TABLET | ORAL | Status: DC | PRN

## 2016-01-20 MED ORDER — WITCH HAZEL-GLYCERIN EX PADS: 1.0000 "application " | MEDICATED_PAD | CUTANEOUS | Status: DC | PRN

## 2016-01-20 MED ORDER — ZOLPIDEM TARTRATE 5 MG PO TABS: 5.0000 mg | ORAL_TABLET | Freq: Every evening | ORAL | Status: DC | PRN

## 2016-01-20 MED ORDER — IBUPROFEN 600 MG PO TABS
600.0000 mg | ORAL_TABLET | Freq: Four times a day (QID) | ORAL | Status: DC
Start: 2016-01-20 — End: 2016-01-22
  Administered 2016-01-20 – 2016-01-22 (×9): 600 mg via ORAL
  Filled 2016-01-20 (×9): qty 1

## 2016-01-20 MED ORDER — DIPHENHYDRAMINE HCL 25 MG PO CAPS: 25.0000 mg | ORAL_CAPSULE | Freq: Four times a day (QID) | ORAL | Status: DC | PRN

## 2016-01-20 NOTE — Anesthesia Postprocedure Evaluation (Signed)
Anesthesia Post Note  Patient: Marie Hampton  Procedure(s) Performed: * No procedures listed *  Patient location during evaluation: Mother Baby Anesthesia Type: Epidural Level of consciousness: awake and alert, oriented and patient cooperative Pain management: pain level controlled Vital Signs Assessment: post-procedure vital signs reviewed and stable Respiratory status: spontaneous breathing Cardiovascular status: stable Postop Assessment: no headache, epidural receding, patient able to bend at knees and no signs of nausea or vomiting Anesthetic complications: no Comments: Pain score 2.     Last Vitals:  Vitals:   01/20/16 0530 01/20/16 0958  BP: 137/67 (!) 105/56  Pulse: (!) 109 91  Resp: 18 18  Temp: 36.6 C 36.7 C    Last Pain:  Vitals:   01/20/16 0958  TempSrc: Oral  PainSc:    Pain Goal: Patients Stated Pain Goal: 10 (01/19/16 [id number])               Merrilyn PumaWRINKLE,Janella Rogala

## 2016-01-20 NOTE — Lactation Note (Signed)
This note was copied from a baby's chart. Lactation Consultation Note  Patient Name: Marie Lorrin JacksonZakia Camper ZOXWR'UToday's Date: 01/20/2016 Reason for consult: Initial assessment Assisted Mom with latching baby at this visit. Reviewed positioning and how to obtain good depth with latch. Basic teaching reviewed with Mom. Encouraged to BF with feeding ques. Lactation brochure left for review, advised of OP services and support group. Encouraged to call for assist as needed with latch.   Maternal Data Has patient been taught Hand Expression?: Yes Does the patient have breastfeeding experience prior to this delivery?: No  Feeding Feeding Type: Breast Fed Length of feed: 0 min  LATCH Score/Interventions Latch: Grasps breast easily, tongue down, lips flanged, rhythmical sucking.  Audible Swallowing: A few with stimulation  Type of Nipple: Everted at rest and after stimulation  Comfort (Breast/Nipple): Soft / non-tender     Hold (Positioning): Assistance needed to correctly position infant at breast and maintain latch. Intervention(s): Breastfeeding basics reviewed;Support Pillows;Position options;Skin to skin  LATCH Score: 8  Lactation Tools Discussed/Used WIC Program: Yes   Consult Status Consult Status: Follow-up Date: 01/21/16 Follow-up type: In-patient    Alfred LevinsGranger, Lashon Hillier Ann 01/20/2016, 11:04 AM

## 2016-01-20 NOTE — Progress Notes (Signed)
Marie Hampton MRN: [id number]  Subjective: -Nurse call reports patient with constant rectal pressure and increased discomfort. In room to assess.   Objective: BP 125/65   Pulse (!) 109   Temp 99.1 F (37.3 C) (Oral)   Resp 20   Ht 5' (1.524 m)   Wt 78.5 kg (173 lb)   LMP 04/19/2015   SpO2 100%   BMI 33.79 kg/m  I/O last 3 completed shifts: In: -  Out: 1000 [Urine:1000] No intake/output data recorded.  Fetal Monitoring: FHT: 135 bpm, Mod Var, +Early Decels, +Accels UC: Q1-475min, palpates moderate, MVUs 135mmHg    Vaginal Exam: SVE:   Dilation: 9 Effacement (%): 90 Station: +2 Exam by:: Sabas SousJ. Tramain Gershman, CNM Membranes:SROM x 11hrs Internal Monitors: IUPC in place  Augmentation/Induction: Pitocin:334mUn/min  Cytotec: None  Assessment:  IUP at 39.2wks Cat I FT  Labor Augmentation  Plan: -Position change to promote fetal descent and rotation -Use peanut to open pelvis and assist with fetal descent and rotation -Continue other mgmt as ordered  Valma CavaJessica L Wylee Dorantes,MSN, CNM 01/20/2016, 12:43 AM

## 2016-01-21 LAB — CBC
HEMATOCRIT: 33 % — AB (ref 36.0–46.0)
HEMOGLOBIN: 11.3 g/dL — AB (ref 12.0–15.0)
MCH: 29.7 pg (ref 26.0–34.0)
MCHC: 34.2 g/dL (ref 30.0–36.0)
MCV: 86.6 fL (ref 78.0–100.0)
Platelets: 169 10*3/uL (ref 150–400)
RBC: 3.81 MIL/uL — ABNORMAL LOW (ref 3.87–5.11)
RDW: 15.3 % (ref 11.5–15.5)
WBC: 13.6 10*3/uL — ABNORMAL HIGH (ref 4.0–10.5)

## 2016-01-21 NOTE — Progress Notes (Signed)
Subjective: Postpartum Day 1: Vaginal delivery, 2nd laceration Patient up ad lib, reports no syncope or dizziness. Feeding:  Breast Contraceptive plan:  Mirena  Objective: Vital signs in last 24 hours: Temp:  [97.8 F (36.6 C)-98.3 F (36.8 C)] 97.8 F (36.6 C) (12/01 0521) Pulse Rate:  [81-114] 114 (12/01 0521) Resp:  [18-20] 20 (12/01 0521) BP: (105-126)/(56-69) 105/65 (12/01 0521) SpO2:  [100 %] 100 % (11/30 0958)  Physical Exam:  General: alert, cooperative and no distress Lochia: appropriate Uterine Fundus: firm Perineum: healing well DVT Evaluation: No evidence of DVT seen on physical exam. Negative Homan's sign.   CBC Latest Ref Rng & Units 01/21/2016 01/20/2016 01/19/2016  WBC 4.0 - 10.5 K/uL 13.6(H) 22.6(H) 13.5(H)  Hemoglobin 12.0 - 15.0 g/dL 11.3(L) 12.2 14.3  Hematocrit 36.0 - 46.0 % 33.0(L) 35.6(L) 40.7  Platelets 150 - 400 K/uL 169 182 227     Assessment/Plan: Status post vaginal delivery day 1. Stable Continue current care. Plan for discharge tomorrow and Breastfeeding    Henderson Newcomerancy Jean ProtheroCNM 01/21/2016, 6:34 AM

## 2016-01-21 NOTE — Progress Notes (Signed)
CSW acknowledged consult and attempted to meet with MOB.  MOB was being attended to by bedside nurse when CSW arrived.  CSW will attempt to meet with MOB at a later time.  Nathanyal Ashmead Boyd-Gilyard, MSW, LCSW Clinical Social Work [phone]   

## 2016-01-21 NOTE — Clinical Social Work Maternal (Signed)
  CLINICAL SOCIAL WORK MATERNAL/CHILD NOTE  Patient Details  Name: Marie Hampton MRN: [id number] Date of Birth: 04/14/1990  Date:  01/21/2016  Clinical Social Worker Initiating Note:  Laurey Arrow Date/ Time Initiated:  01/21/16/1224     Child's Name:  Marie Hampton   Legal Guardian:  Mother (FOB is Lymon "Lytle Michaels" Hannula 03/21/1991)   Need for Interpreter:  None   Date of Referral:  01/21/16     Reason for Referral:  Current Substance Use/Substance Use During Pregnancy  (hx of THC use.)   Referral Source:  Central [facility]   Address:  [address] [place] [phone]  Phone number:  [phone]   Household Members:  Self, Parents   Natural Supports (not living in the home):  Spouse/significant other (FOB's Family )   Professional Supports: None   Employment: Full-time   Type of Work: Public librarian   Education:  Engineer, maintenance Resources:  [employer]   Other Resources:  [employer], Physicist, medical    Cultural/Religious Considerations Which May Impact Care:  Per [employer], MOB is [employer].  Strengths:  Ability to meet basic needs , Pediatrician chosen    Risk Factors/Current Problems:  Substance Use    Cognitive State:  Alert , Able to Concentrate , Linear Thinking    Mood/Affect:  Bright , Calm , Happy , Comfortable    CSW Assessment: CSW met with MOB to complete an assessment for hx THC use.  MOB was receptive to meeting with CSW.  When CSW arrived, FOB (Lymon [employer]; 03/21/2015) was leaving. MOB gave CSW permission to complete assessment if FOB returned. CSW inquired about MOB's substance use hx, and MOB acknowledged the use of marijuana prior to pregnancy confirmation.  MOB denied the use of any substance after pregnancy confirmation. CSW informed MOB of the hospital's drug screen policy, and informed MOB of the 2 screenings for the infant. MOB appeared understanding and not concerned about the infant having a positive UDS or Cord screen. CSW  shared with MOB, that the infant's UDS negative and CSW will monition the infant's Cord Screen. CSW made MOB aware that if the infant's Cord is positive without an explanation, CSW will make a report to [facility] CPS.  MOB did not have any questions regarding the hospital's policy. CSW offered MOB resources and referrals for SA, and MOB declined.  CSW thanked MOB for meeting with CSW and provided MOB with CSW contact information.  CSW Plan/Description:  Information/Referral to [employer] , [employer] , No Further Intervention Required/No Barriers to Discharge (CSW will monitor infant's Cord Screen and will make a report to [place] if warranted. )   Laurey Arrow, MSW, LCSW Clinical Social Work [phone]  Dimple Nanas, LCSW 01/21/2016, 12:29 PM

## 2016-01-22 MED ORDER — IBUPROFEN 600 MG PO TABS: 600.0000 mg | ORAL_TABLET | Freq: Four times a day (QID) | ORAL | 0 refills | Status: DC

## 2016-01-22 NOTE — Discharge Instructions (Signed)

## 2016-01-22 NOTE — Discharge Summary (Signed)
Obstetric Discharge Summary  Reason for Admission: onset of labor on 01/19/16 Prenatal Procedures: none Intrapartum Procedures: spontaneous vaginal delivery by Sabas SousJ. Emly CNM on 01/20/16 Postpartum Procedures: none Complications-Operative and Postpartum: 2 perineal laceration  Hemoglobin  Date Value Ref Range Status  01/21/2016 11.3 (L) 12.0 - 15.0 g/dL Final   HCT  Date Value Ref Range Status  01/21/2016 33.0 (L) 36.0 - 46.0 % Final   Delivery Note 0154: At bedside after nurse reports patient C/C/+2 and pushing with good efforts.  Cat II FT as variable decelerations noted with pushing efforts.  Patient with O2 via NRB mask and s/p fluid bolus.  Patient delivered as below with staff and family support.   At 2:21 AM, on Nov. 30, 2017, a viable female "Marie Hampton" was delivered via Vaginal, Spontaneous Delivery (Presentation: Right Occiput Anterior with restitution to ROT). Shoulders delivered easily and mother encouraged to embrace infant and deliver body. Infant placed on mother's abdomen with semi-flaccid tone and minimal grimace. Tactile stimulation given by provider and infant with improving tone and grimace.  Infant APGAR: 7,9. Cord clamped, cut, and blood collected for donation. Placenta delivered spontaneously, after 40 minutes, and noted to be intact with 3VC upon inspection.  Vaginal inspection revealed a 2nd degree perineal laceration that was repaired with 3-0 vicryl on CT-1.  Additional anesthetic necessary and patient tolerated the procedure well. Fundus firm, at the umbilicus, and bleeding small.  Mother hemodynamically stable and infant skin to skin prior to provider exit.  Mother desires birth control, but is unsure of method.  Patient does opt to breastfeed.  Infant weight at one hour of life: 7lbs 3oz, 19.75in  Anesthesia:  Epidural Episiotomy: None Lacerations: 2nd degree;Perineal Suture Repair: 3.0 vicryl Est. Blood Loss (mL): 400  Mom to postpartum.  Baby to Couplet care /  Skin to Skin.  Cherre RobinsJessica L Emly MSN, CNM 01/20/2016, 3:36 AM  Discharge Diagnoses: Term Pregnancy-delivered  Physical exam:   General: normal Lochia: appropriate Uterine Fundus: @ U firm non-tender  Extremities: No evidence of DVT seen on physical exam. Edema none   Hospital course: Uncomplicated  Date: 01/22/2016 Activity: unrestricted Diet: routine Medications: PNV and Ibuprofen Condition: stable  Breastfeeding:   Yes.   Contraception:  *Mirena  Instructions: refer to practice specific booklet Discharge to: home Follow-up Information    [facility] Follow up in 5 week(s).   Specialty:  Obstetrics and Gynecology Why:  Tell office that you want a Mirena IUD Contact information: [address] [place] [phone]          Newborn Data:   Baby female Name: Marie DimesDemi  Marie Hampton CNM 01/22/2016, 11:07 AM

## 2016-01-22 NOTE — Lactation Note (Signed)
This note was copied from a baby'Hampton chart. Lactation Consultation Note  Patient Name: Marie Hampton OZHYQ'MToday'Hampton Date: 01/22/2016  Follow up visit made prior to discharge.  Mom states baby is cluster feeding.  Reviewed with parents this is normal.  Discussed milk coming to volume and engorgement treatment.  Manual pump given to mom for prn use.  Discharge teaching done and questions answered.  Lactation outpatient services and support reviewed and encouraged.   Maternal Data    Feeding Feeding Type: Breast Fed Length of feed: 10 min  LATCH Score/Interventions Latch: Grasps breast easily, tongue down, lips flanged, rhythmical sucking.  Audible Swallowing: A few with stimulation  Type of Nipple: Everted at rest and after stimulation Intervention(Hampton):  (coconut oil)  Comfort (Breast/Nipple): Soft / non-tender     Hold (Positioning): No assistance needed to correctly position infant at breast.  LATCH Score: 9  Lactation Tools Discussed/Used     Consult Status      Huston FoleyMOULDEN, Marie Hampton 01/22/2016, 9:45 AM

## 2017-09-30 IMAGING — US US OB TRANSVAGINAL
1 series · 15 of 28 positions shown · non-contrast
Comparison: None.

CLINICAL DATA: Pregnant, abdominal pain

EXAM:
OBSTETRIC <14 WK US AND TRANSVAGINAL OB US
TECHNIQUE: Both transabdominal and transvaginal ultrasound examinations were
performed for complete evaluation of the gestation as well as the
maternal uterus, adnexal regions, and pelvic cul-de-sac.
Transvaginal technique was performed to assess early pregnancy.

[Series 1: us ob transvaginal · 15 of 58 slices shown]
[im 1/58]
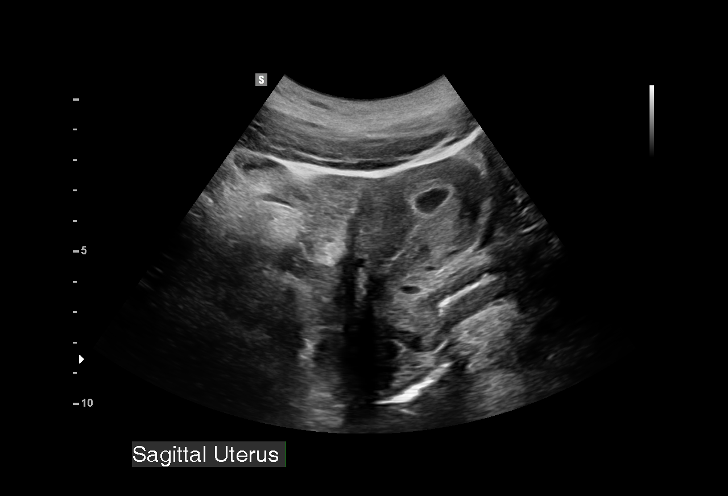
[im 5/58]
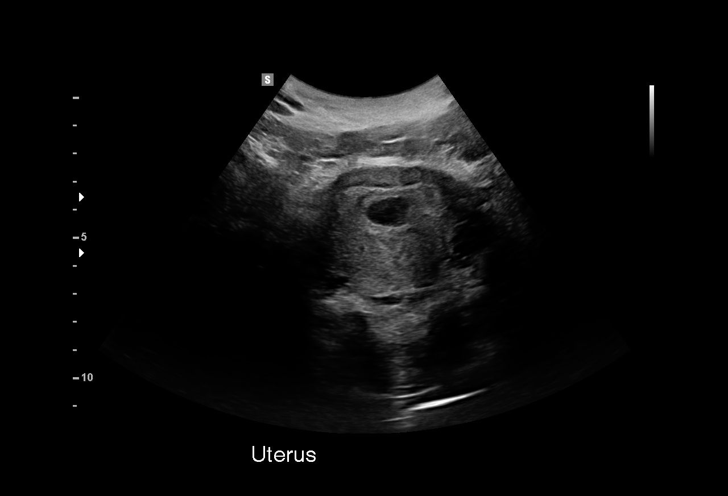
[im 9/58]
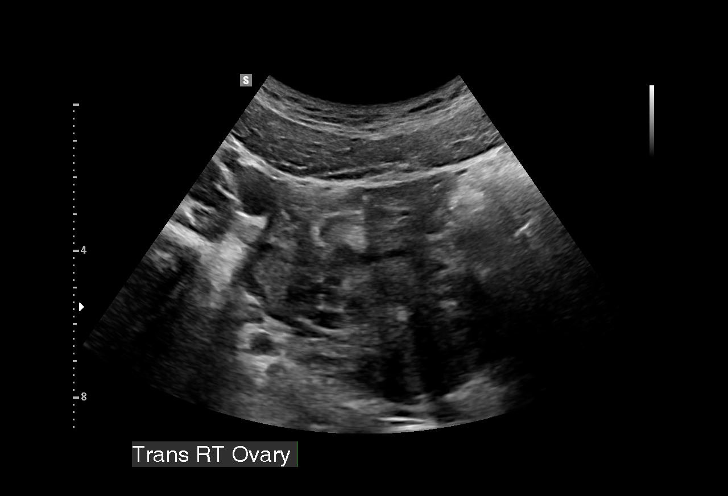
[im 13/58]
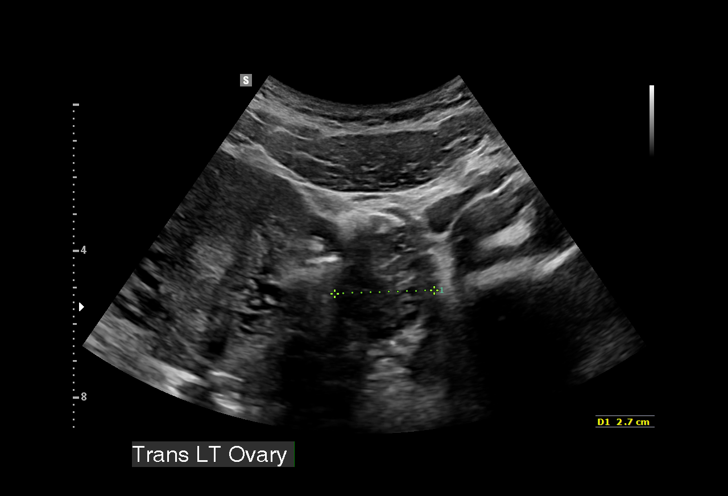
[im 17/58]
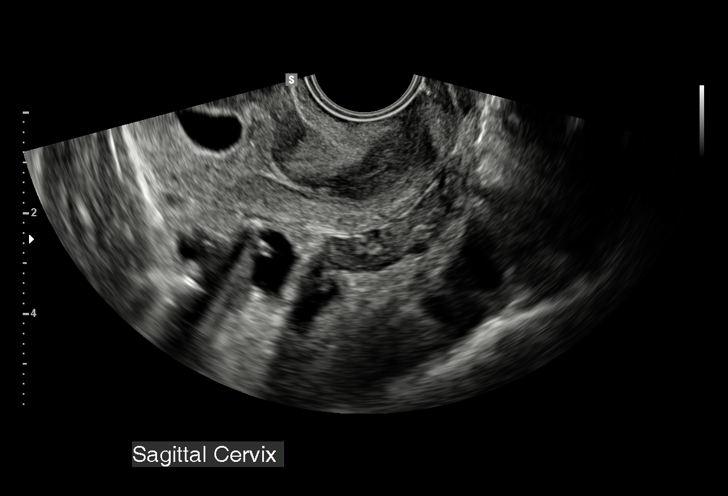
[im 22/58]
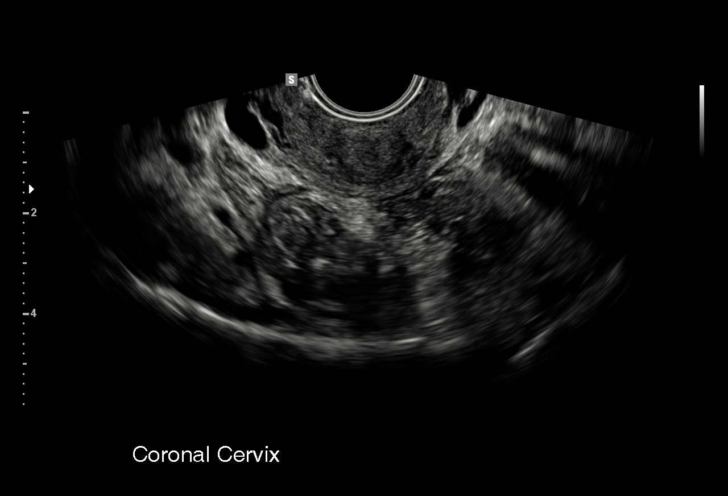
[im 26/58]
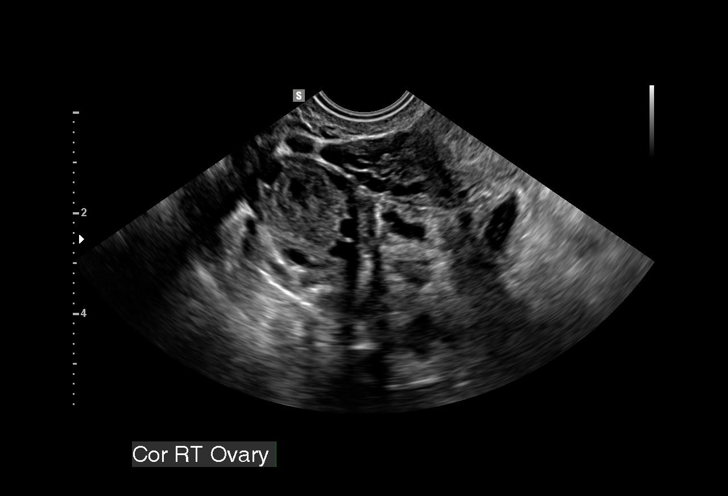
[im 30/58]
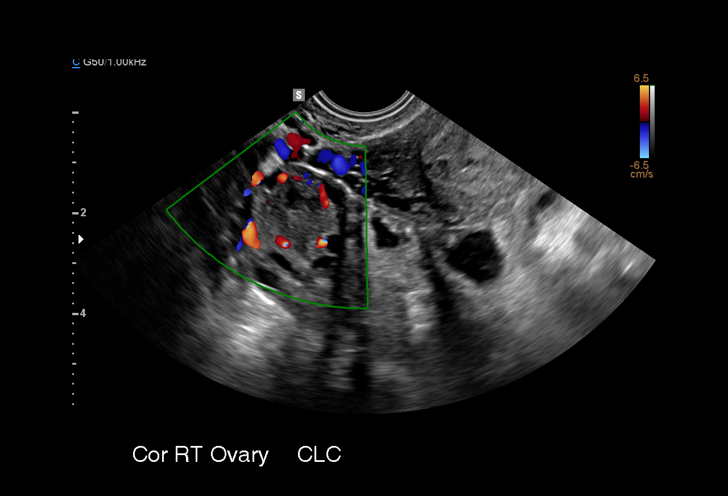
[im 32/58]
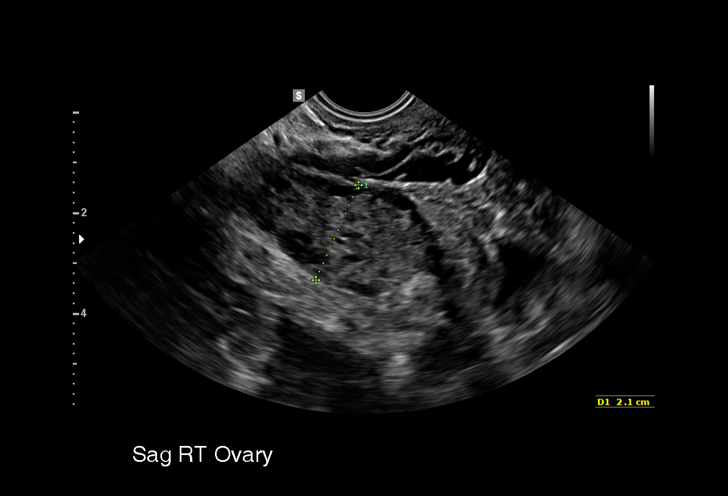
[im 36/58]
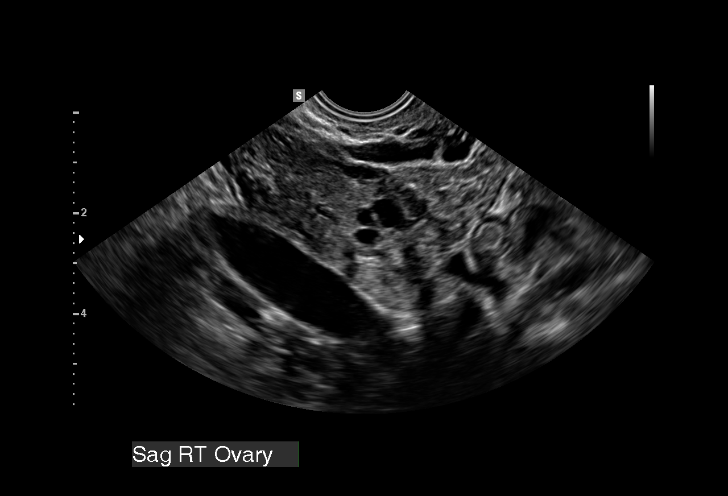
[im 41/58]
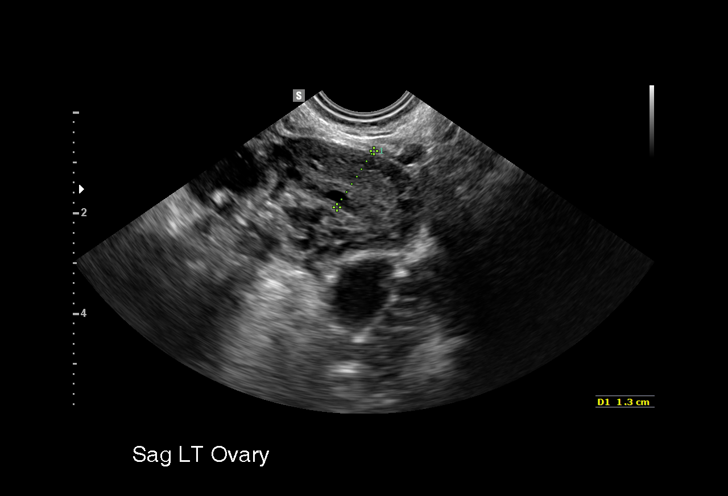
[im 45/58]
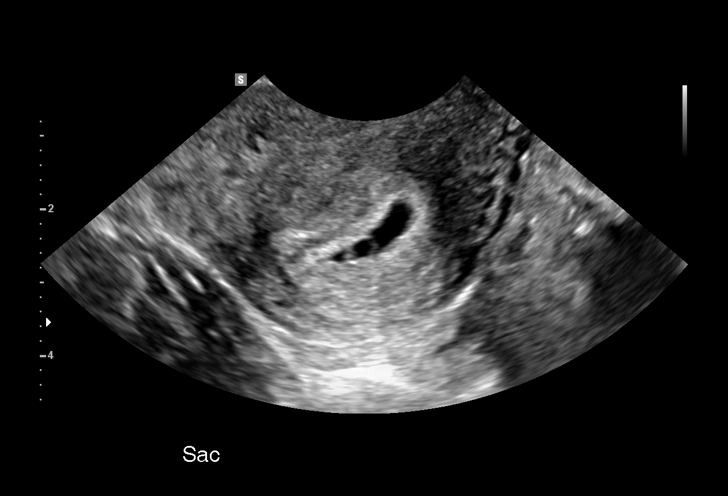
[im 49/58]
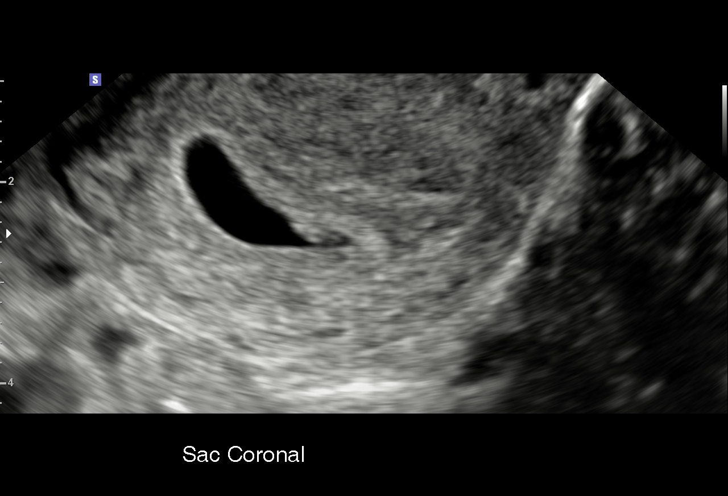
[im 53/58]
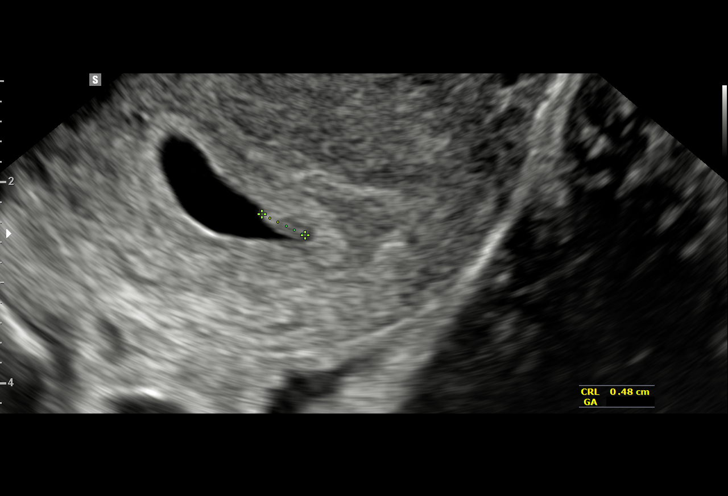
[im 58/58]
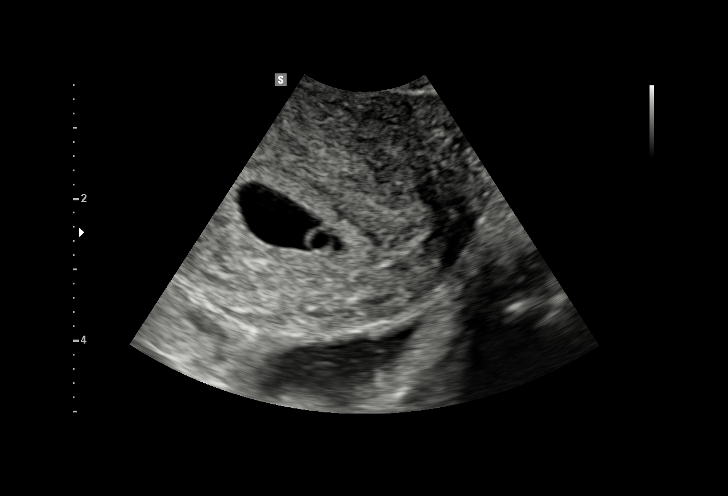

[15 of 28 positions shown; findings below may reference images not displayed]

FINDINGS: Intrauterine gestational sac: Single

Yolk sac:  Present

Embryo:  Present

Cardiac Activity: Present

Heart Rate: 99  bpm

CRL:  5.0  mm   6 w   1 d                  US EDC: 01/24/2016

Subchorionic hemorrhage:  None visualized.

Maternal uterus/adnexae: Bilateral ovaries are within normal limits,
noting a right corpus luteal cyst.

No free fluid.
IMPRESSION: Single live intrauterine gestation, with estimated gestational age 6
weeks 1 day by crown-rump length.

## 2018-11-27 ENCOUNTER — Other Ambulatory Visit: Payer: Self-pay

## 2018-11-27 DIAGNOSIS — Z20822 Contact with and (suspected) exposure to covid-19: Secondary | ICD-10-CM

## 2018-11-28 LAB — NOVEL CORONAVIRUS, NAA: SARS-CoV-2, NAA: NOT DETECTED

## 2021-03-15 ENCOUNTER — Other Ambulatory Visit: Payer: Self-pay

## 2021-03-15 ENCOUNTER — Ambulatory Visit (INDEPENDENT_AMBULATORY_CARE_PROVIDER_SITE_OTHER): Payer: Self-pay

## 2021-03-15 DIAGNOSIS — N912 Amenorrhea, unspecified: Secondary | ICD-10-CM

## 2021-03-15 LAB — POCT URINE PREGNANCY: Preg Test, Ur: POSITIVE — AB

## 2021-03-15 NOTE — Progress Notes (Signed)
°   Marie Hampton presents today for UPT. She has no unusual complaints. LMP: 02/08/21  EDD 11/15/21. Patient is [redacted]w[redacted]d today.    OBJECTIVE: Appears well, in no apparent distress.  OB History     Gravida  1   Para  1   Term  1   Preterm      AB      Living  1      SAB      IAB      Ectopic      Multiple  0   Live Births  1          Home UPT Result:Positive In-Office UPT result:Positive I have reviewed the patient's medical, obstetrical, social, and family histories, and medications.   ASSESSMENT: Positive pregnancy test  PLAN Prenatal care to be completed at: Patient is unsure about pregnancy at this time. Patient will call to schedule if she decides to continue pregnancy. No future appt scheduled at this time.

## 2021-05-10 NOTE — Progress Notes (Signed)
Patient was assessed and managed by nursing staff during this encounter. I have reviewed the chart and agree with the documentation and plan. I have also made any necessary editorial changes.  Scheryl Darter, MD 05/10/2021 2:01 PM

## 2023-02-21 NOTE — L&D Delivery Note (Signed)
 Delivery Note:   G3P1011 at [redacted]w[redacted]d  Admitting diagnosis: Normal labor [O80, Z37.9] Risks: None Onset of labor: 11/19/2023 at 0800 IOL/Augmentation: N/A ROM: 11/19/2023 at 2001, moderate meconium  Complete dilation at 11/19/2023 2001 Onset of pushing at 2018 FHR second stage Cat I  Analgesia/Anesthesia intrapartum:Epidural Pushing in lithotomy position with CNM and L&D staff support. Partner, family, and doula present for birth and supportive.  Delivery of a Live born female  Birth Weight:  pending APGAR: 8, 9  Newborn Delivery   Birth date/time: 11/19/2023 20:29:41 Delivery type: Vaginal, Spontaneous    in cephalic presentation, position OA to ROA.  APGAR:1 min-8 , 5 min-9   Nuchal Cord: Yes  x 1 Cord double clamped after cessation of pulsation, cut by partner.  Collection of cord blood for typing completed. Arterial cord blood sample-No   Placenta delivered-Spontaneous with 3 vessels. Uterotonics: None Placenta to L&D Uterine tone firm  Bleeding small  1st degree laceration identified.  Episiotomy:None Local analgesia: N/A  Repair: 3-0 in usual fashion Est. Blood Loss (mL):130.00 Complications: None  Mom to postpartum. Baby Krown to Couplet care / Skin to Skin.  Delivery Report:   Review the Delivery Report for details.    Alan MARLA Molt, CNM, MSN 11/19/2023, 9:27 PM

## 2023-03-20 ENCOUNTER — Encounter (HOSPITAL_COMMUNITY): Payer: Self-pay | Admitting: Obstetrics and Gynecology

## 2023-03-20 ENCOUNTER — Inpatient Hospital Stay (HOSPITAL_COMMUNITY): Payer: Medicaid Other

## 2023-03-20 ENCOUNTER — Inpatient Hospital Stay (HOSPITAL_COMMUNITY)
Admission: AD | Admit: 2023-03-20 | Discharge: 2023-03-20 | Disposition: A | Discharge status: Home / Self Care | Payer: Medicaid Other | Attending: Obstetrics and Gynecology | Admitting: Obstetrics and Gynecology

## 2023-03-20 DIAGNOSIS — B9689 Other specified bacterial agents as the cause of diseases classified elsewhere: Secondary | ICD-10-CM | POA: Insufficient documentation

## 2023-03-20 DIAGNOSIS — O26851 Spotting complicating pregnancy, first trimester: Secondary | ICD-10-CM | POA: Diagnosis present

## 2023-03-20 DIAGNOSIS — O3411 Maternal care for benign tumor of corpus uteri, first trimester: Secondary | ICD-10-CM | POA: Insufficient documentation

## 2023-03-20 DIAGNOSIS — O3680X0 Pregnancy with inconclusive fetal viability, not applicable or unspecified: Secondary | ICD-10-CM | POA: Insufficient documentation

## 2023-03-20 DIAGNOSIS — Z3A01 Less than 8 weeks gestation of pregnancy: Secondary | ICD-10-CM | POA: Diagnosis not present

## 2023-03-20 DIAGNOSIS — O23591 Infection of other part of genital tract in pregnancy, first trimester: Secondary | ICD-10-CM | POA: Diagnosis not present

## 2023-03-20 DIAGNOSIS — D219 Benign neoplasm of connective and other soft tissue, unspecified: Secondary | ICD-10-CM | POA: Insufficient documentation

## 2023-03-20 LAB — URINALYSIS, ROUTINE W REFLEX MICROSCOPIC
Bacteria, UA: NONE SEEN
Bilirubin Urine: NEGATIVE
Glucose, UA: NEGATIVE mg/dL
Hgb urine dipstick: NEGATIVE
Ketones, ur: NEGATIVE mg/dL
Leukocytes,Ua: NEGATIVE
Nitrite: NEGATIVE
Protein, ur: NEGATIVE mg/dL
Specific Gravity, Urine: 1.016 (ref 1.005–1.030)
pH: 6 (ref 5.0–8.0)

## 2023-03-20 LAB — COMPREHENSIVE METABOLIC PANEL WITH GFR
ALT: 24 U/L (ref 0–44)
AST: 29 U/L (ref 15–41)
Albumin: 4.4 g/dL (ref 3.5–5.0)
Alkaline Phosphatase: 46 U/L (ref 38–126)
Anion gap: 13 (ref 5–15)
BUN: 9 mg/dL (ref 6–20)
CO2: 22 mmol/L (ref 22–32)
Calcium: 9.5 mg/dL (ref 8.9–10.3)
Chloride: 102 mmol/L (ref 98–111)
Creatinine, Ser: 0.97 mg/dL (ref 0.44–1.00)
GFR, Estimated: >60 mL/min
Glucose, Bld: 95 mg/dL (ref 70–99)
Potassium: 4.2 mmol/L (ref 3.5–5.1)
Sodium: 137 mmol/L (ref 135–145)
Total Bilirubin: 0.9 mg/dL (ref 0.0–1.2)
Total Protein: 7.8 g/dL (ref 6.5–8.1)

## 2023-03-20 LAB — ABO/RH: ABO/RH(D): A POS

## 2023-03-20 LAB — CBC
HCT: 40.8 % (ref 36.0–46.0)
Hemoglobin: 13.9 g/dL (ref 12.0–15.0)
MCH: 31.2 pg (ref 26.0–34.0)
MCHC: 34.1 g/dL (ref 30.0–36.0)
MCV: 91.5 fL (ref 80.0–100.0)
Platelets: 246 10*3/uL (ref 150–400)
RBC: 4.46 MIL/uL (ref 3.87–5.11)
RDW: 12 % (ref 11.5–15.5)
WBC: 5.8 10*3/uL (ref 4.0–10.5)
nRBC: 0 % (ref 0.0–0.2)

## 2023-03-20 LAB — WET PREP, GENITAL
Sperm: NONE SEEN
Trich, Wet Prep: NONE SEEN
WBC, Wet Prep HPF POC: >=10 — AB (ref <10)
Yeast Wet Prep HPF POC: NONE SEEN

## 2023-03-20 LAB — HCG, QUANTITATIVE, PREGNANCY: hCG, Beta Chain, Quant, S: 12747 m[IU]/mL — ABNORMAL HIGH (ref <5)

## 2023-03-20 LAB — POCT PREGNANCY, URINE: Preg Test, Ur: POSITIVE — AB

## 2023-03-20 MED ORDER — METRONIDAZOLE 500 MG PO TABS: 500.0000 mg | ORAL_TABLET | Freq: Two times a day (BID) | ORAL | 0 refills | Status: AC

## 2023-03-20 NOTE — Discharge Instructions (Signed)

## 2023-03-20 NOTE — MAU Provider Note (Addendum)
Chief Complaint: Vaginal Bleeding  SUBJECTIVE HPI: Marie Hampton is a 33 y.o. G3P1001 at [redacted]w[redacted]d by LMP who presents to maternity admissions reporting intermittent abdominal cramping and vaginal spotting. Denies pain here in MAU.  She denies vaginal bleeding, vaginal itching/burning, urinary symptoms, h/a, dizziness, n/v, or fever/chills.    HPI  Past Medical History:  Diagnosis Date   Headache(784.0)    Infection    UTI   Migraine with aura 05/13/2012   Past Surgical History:  Procedure Laterality Date   NO PAST SURGERIES     Social History   Socioeconomic History   Marital status: Single    Spouse name: Not on file   Number of children: Not on file   Years of education: Not on file   Highest education level: Not on file  Occupational History   Occupation: Dentist: UNEMPLOYED  Tobacco Use   Smoking status: Never   Smokeless tobacco: Never  Substance and Sexual Activity   Alcohol use: Not Currently    Alcohol/week: 3.0 standard drinks of alcohol    Types: 3 Standard drinks or equivalent per week    Comment: Occasional, last 4/9   Drug use: Yes    Frequency: 7.0 times per week    Types: Marijuana    Comment: last used 03/17/2023   Sexual activity: Yes    Birth control/protection: None  Other Topics Concern   Not on file  Social History Narrative    Right handed 33 year old AA female is not married and lives at home with her mother, father and sister.  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  She occasionally drinks alcohol and uses recreational drugs socially.   Social Drivers of Corporate investment banker Strain: Not on file  Food Insecurity: Not on file  Transportation Needs: Not on file  Physical Activity: Not on file  Stress: Not on file  Social Connections: Not on file  Intimate Partner Violence: Not on file   No current facility-administered medications on file prior to encounter.   Current Outpatient Medications on File Prior to  Encounter  Medication Sig Dispense Refill   ibuprofen (ADVIL,MOTRIN) 600 MG tablet Take 1 tablet (600 mg total) by mouth every 6 (six) hours. 30 tablet 0   Prenatal Vit-Fe Fumarate-FA (PRENATAL MULTIVITAMIN) TABS tablet Take 1 tablet by mouth every morning.      No Known Allergies  ROS:  Pertinent positives/negatives listed above.  I have reviewed patient's Past Medical Hx, Surgical Hx, Family Hx, Social Hx, medications and allergies.   Physical Exam  Patient Vitals for the past 24 hrs:  BP Temp Temp src Pulse Resp SpO2 Height Weight  03/20/23 1100 124/76 98.1 F (36.7 C) Oral 81 16 100 % -- --  03/20/23 1058 -- -- -- -- -- -- 5' (1.524 m) 65 kg   Constitutional: Well-developed, well-nourished female in no acute distress.  Cardiovascular: normal rate Respiratory: normal effort GI: Abd soft, non-tender. Pos BS x 4 MS: Extremities nontender, no edema, normal ROM Neurologic: Alert and oriented x 4.  GU: Neg CVAT.  LAB RESULTS Results for orders placed or performed during the hospital encounter of 03/20/23 (from the past 24 hours)  Wet prep, genital     Status: Abnormal   Collection Time: 03/20/23 11:08 AM   Specimen: PATH Cytology Cervicovaginal Ancillary Only  Result Value Ref Range   Yeast Wet Prep HPF POC NONE SEEN NONE SEEN   Trich, Wet Prep NONE  SEEN NONE SEEN   Clue Cells Wet Prep HPF POC PRESENT (A) NONE SEEN   WBC, Wet Prep HPF POC >=10 (A) <10   Sperm NONE SEEN   Pregnancy, urine POC     Status: Abnormal   Collection Time: 03/20/23 11:12 AM  Result Value Ref Range   Preg Test, Ur POSITIVE (A) NEGATIVE  ABO/Rh     Status: None   Collection Time: 03/20/23 11:36 AM  Result Value Ref Range   ABO/RH(D) A POS    No rh immune globuloin      NOT A RH IMMUNE GLOBULIN CANDIDATE, PT RH POSITIVE Performed at [facility], [address], [place], [place] [address]   CBC     Status: None   Collection Time: 03/20/23 11:38 AM  Result Value Ref Range   WBC 5.8 4.0 -  10.5 K/uL   RBC 4.46 3.87 - 5.11 MIL/uL   Hemoglobin 13.9 12.0 - 15.0 g/dL   HCT 30.8 65.7 - 84.6 %   MCV 91.5 80.0 - 100.0 fL   MCH 31.2 26.0 - 34.0 pg   MCHC 34.1 30.0 - 36.0 g/dL   RDW 96.2 95.2 - 84.1 %   Platelets 246 150 - 400 K/uL   nRBC 0.0 0.0 - 0.2 %  Comprehensive metabolic panel     Status: None   Collection Time: 03/20/23 11:38 AM  Result Value Ref Range   Sodium 137 135 - 145 mmol/L   Potassium 4.2 3.5 - 5.1 mmol/L   Chloride 102 98 - 111 mmol/L   CO2 22 22 - 32 mmol/L   Glucose, Bld 95 70 - 99 mg/dL   BUN 9 6 - 20 mg/dL   Creatinine, Ser 3.24 0.44 - 1.00 mg/dL   Calcium 9.5 8.9 - 40.1 mg/dL   Total Protein 7.8 6.5 - 8.1 g/dL   Albumin 4.4 3.5 - 5.0 g/dL   AST 29 15 - 41 U/L   ALT 24 0 - 44 U/L   Alkaline Phosphatase 46 38 - 126 U/L   Total Bilirubin 0.9 0.0 - 1.2 mg/dL   GFR, Estimated >02 >72 mL/min   Anion gap 13 5 - 15  hCG, quantitative, pregnancy     Status: Abnormal   Collection Time: 03/20/23 11:38 AM  Result Value Ref Range   hCG, Beta Chain, Quant, S 12,747 (H) <5 mIU/mL  Urinalysis, Routine w reflex microscopic -Urine, Clean Catch     Status: Abnormal   Collection Time: 03/20/23 12:12 PM  Result Value Ref Range   Color, Urine YELLOW YELLOW   APPearance HAZY (A) CLEAR   Specific Gravity, Urine 1.016 1.005 - 1.030   pH 6.0 5.0 - 8.0   Glucose, UA NEGATIVE NEGATIVE mg/dL   Hgb urine dipstick NEGATIVE NEGATIVE   Bilirubin Urine NEGATIVE NEGATIVE   Ketones, ur NEGATIVE NEGATIVE mg/dL   Protein, ur NEGATIVE NEGATIVE mg/dL   Nitrite NEGATIVE NEGATIVE   Leukocytes,Ua NEGATIVE NEGATIVE   RBC / HPF 0-5 0 - 5 RBC/hpf   WBC, UA 0-5 0 - 5 WBC/hpf   Bacteria, UA NONE SEEN NONE SEEN   Squamous Epithelial / HPF 0-5 0 - 5 /HPF   Mucus PRESENT     --/--/A POS (01/28 1136)  IMAGING US OB LESS THAN 14 WEEKS WITH OB TRANSVAGINAL Result Date: 03/20/2023 CLINICAL DATA:  Vaginal bleeding.  Beta hCG = 12,747 EXAM: OBSTETRIC <14 WK [place] AND TRANSVAGINAL OB US  TECHNIQUE: Both transabdominal and transvaginal ultrasound examinations were  performed for complete evaluation of the gestation as well as the maternal uterus, adnexal regions, and pelvic cul-de-sac. Transvaginal technique was performed to assess early pregnancy. COMPARISON:  None Available. FINDINGS: Intrauterine gestational sac: Single Yolk sac:  Visualized. Embryo:  Not Visualized. MSD: 8.2 mm   5 w   4 d Subchorionic hemorrhage:  None visualized. Maternal uterus/adnexae: Hypoechoic subserosal mass in the anterior midline uterine body measures 2.0 x 1.7 x 1.7 cm, likely leiomyoma. Normal ovaries. Right ovarian corpus luteum. IMPRESSION: 1. Probable early intrauterine gestational sac and yolk sac, but no fetal pole or cardiac activity yet visualized. Recommend follow-up quantitative B-HCG levels and follow-up US in 14 days to assess viability. This recommendation follows SRU consensus guidelines: Diagnostic Criteria for Nonviable Pregnancy Early in the First Trimester. Malva Limes Med 2013; 161:0960-45. 2. Subserosal hypoechoic mass in the anterior midline uterine body, likely leiomyoma. Electronically Signed   By: Agustin Cree M.D.   On: 03/20/2023 13:51    MAU Management/MDM: Orders Placed This Encounter  Procedures   Wet prep, genital   US OB LESS THAN 14 WEEKS WITH OB TRANSVAGINAL   Urinalysis, Routine w reflex microscopic -Urine, Clean Catch   CBC   Comprehensive metabolic panel   hCG, quantitative, pregnancy   Pregnancy, urine POC   ABO/Rh   Discharge patient    Meds ordered this encounter  Medications   metroNIDAZOLE (FLAGYL) 500 MG tablet    Sig: Take 1 tablet (500 mg total) by mouth 2 (two) times daily for 7 days.    Dispense:  14 tablet    Refill:  0    Treatments in MAU included CBC, CMP, bHCG, UPT, UA, Wet prep, GC/Chlamydia.  ASSESSMENT 1. [redacted] weeks gestation of pregnancy   2. Spotting affecting pregnancy in first trimester   3. Bacterial vaginosis   4. Fibroid   Suspect pink  spotting due to BV infection in early pregnancy - Flagyl  500mg  BID x 7 days   PLAN Probable early intrauterine gestational sac and yolk sac, but no fetal pole or cardiac activity yet visualized. Recommend follow-up quantitative B-HCG levels and follow-up US in 14 days to assess viability.  Discharge home with strict return precautions. Allergies as of 03/20/2023   No Known Allergies      Medication List     STOP taking these medications    ibuprofen 600 MG tablet Commonly known as: ADVIL       TAKE these medications    metroNIDAZOLE 500 MG tablet Commonly known as: Flagyl Take 1 tablet (500 mg total) by mouth 2 (two) times daily for 7 days.   prenatal multivitamin Tabs tablet Take 1 tablet by mouth every morning.        Follow-up Information     [facility] Follow up.   Specialty: Obstetrics and Gynecology Why: As needed for pregnancy emergencies Contact information: [address] [place] [address] [phone]                Wyn Forster, MD FMOB Fellow, Faculty practice [facility], Center for [facility]  03/20/2023  2:00 PM

## 2023-03-20 NOTE — MAU Note (Signed)
...  Marie Hampton is a 33 y.o. at Unknown here in MAU reporting: Occasional lower abdominal cramping as well as occasional vaginal spotting. She reports the spotting is pink. Denies recent IC. Denies vaginal itching and vaginal odors.  LMP: 02/13/2023 Onset of complaint: Two days ago Pain score: Denies current pain.  Vitals:   03/20/23 1100  BP: 124/76  Pulse: 81  Resp: 16  Temp: 98.1 F (36.7 C)  SpO2: 100%     FHT: n/a Lab orders placed from triage: POCT Preg

## 2023-03-21 ENCOUNTER — Telehealth: Payer: Self-pay | Admitting: Family Medicine

## 2023-03-21 ENCOUNTER — Other Ambulatory Visit: Payer: Self-pay | Admitting: Lactation Services

## 2023-03-21 DIAGNOSIS — Z3A01 Less than 8 weeks gestation of pregnancy: Secondary | ICD-10-CM

## 2023-03-21 DIAGNOSIS — O26851 Spotting complicating pregnancy, first trimester: Secondary | ICD-10-CM

## 2023-03-21 LAB — GC/CHLAMYDIA PROBE AMP (~~LOC~~) NOT AT ARMC
Chlamydia: NEGATIVE
Comment: NEGATIVE
Comment: NEGATIVE
Comment: NORMAL
Neisseria Gonorrhea: NEGATIVE
Trichomonas: NEGATIVE

## 2023-03-21 NOTE — Telephone Encounter (Signed)
Left patient a detailed message in re guards to her appointment change for her [facility]

## 2023-03-26 ENCOUNTER — Ambulatory Visit (HOSPITAL_COMMUNITY): Admission: RE | Admit: 2023-03-26 | Payer: Medicaid Other | Source: Ambulatory Visit

## 2023-04-03 ENCOUNTER — Other Ambulatory Visit: Payer: Self-pay

## 2023-04-03 ENCOUNTER — Encounter (HOSPITAL_COMMUNITY): Payer: Self-pay | Admitting: Obstetrics and Gynecology

## 2023-04-03 ENCOUNTER — Encounter (HOSPITAL_COMMUNITY): Payer: Self-pay

## 2023-04-03 ENCOUNTER — Inpatient Hospital Stay (HOSPITAL_COMMUNITY)
Admission: AD | Admit: 2023-04-03 | Discharge: 2023-04-03 | Disposition: A | Discharge status: Home / Self Care | Payer: No Typology Code available for payment source | Attending: Obstetrics and Gynecology | Admitting: Obstetrics and Gynecology

## 2023-04-03 ENCOUNTER — Ambulatory Visit (HOSPITAL_COMMUNITY): Admission: RE | Admit: 2023-04-03 | Payer: Medicaid Other | Source: Ambulatory Visit

## 2023-04-03 DIAGNOSIS — B379 Candidiasis, unspecified: Secondary | ICD-10-CM | POA: Diagnosis not present

## 2023-04-03 DIAGNOSIS — R109 Unspecified abdominal pain: Secondary | ICD-10-CM | POA: Diagnosis not present

## 2023-04-03 DIAGNOSIS — Z3A01 Less than 8 weeks gestation of pregnancy: Secondary | ICD-10-CM | POA: Insufficient documentation

## 2023-04-03 DIAGNOSIS — O209 Hemorrhage in early pregnancy, unspecified: Secondary | ICD-10-CM | POA: Diagnosis not present

## 2023-04-03 DIAGNOSIS — O26891 Other specified pregnancy related conditions, first trimester: Secondary | ICD-10-CM | POA: Diagnosis present

## 2023-04-03 DIAGNOSIS — O98811 Other maternal infectious and parasitic diseases complicating pregnancy, first trimester: Secondary | ICD-10-CM | POA: Diagnosis not present

## 2023-04-03 DIAGNOSIS — O26899 Other specified pregnancy related conditions, unspecified trimester: Secondary | ICD-10-CM

## 2023-04-03 LAB — CBC
HCT: 41.5 % (ref 36.0–46.0)
Hemoglobin: 14.4 g/dL (ref 12.0–15.0)
MCH: 31.4 pg (ref 26.0–34.0)
MCHC: 34.7 g/dL (ref 30.0–36.0)
MCV: 90.6 fL (ref 80.0–100.0)
Platelets: 261 10*3/uL (ref 150–400)
RBC: 4.58 MIL/uL (ref 3.87–5.11)
RDW: 11.9 % (ref 11.5–15.5)
WBC: 7.5 10*3/uL (ref 4.0–10.5)
nRBC: 0 % (ref 0.0–0.2)

## 2023-04-03 LAB — WET PREP, GENITAL
Sperm: NONE SEEN
Trich, Wet Prep: NONE SEEN
WBC, Wet Prep HPF POC: >=10 — AB (ref <10)

## 2023-04-03 LAB — URINALYSIS, ROUTINE W REFLEX MICROSCOPIC
Bilirubin Urine: NEGATIVE
Glucose, UA: NEGATIVE mg/dL
Hgb urine dipstick: NEGATIVE
Ketones, ur: NEGATIVE mg/dL
Leukocytes,Ua: NEGATIVE
Nitrite: NEGATIVE
Protein, ur: NEGATIVE mg/dL
Specific Gravity, Urine: 1.02 (ref 1.005–1.030)
pH: 6.5 (ref 5.0–8.0)

## 2023-04-03 LAB — ABO/RH: ABO/RH(D): A POS

## 2023-04-03 LAB — HCG, QUANTITATIVE, PREGNANCY: hCG, Beta Chain, Quant, S: 133707 m[IU]/mL — ABNORMAL HIGH (ref <5)

## 2023-04-03 MED ORDER — CLOTRIMAZOLE 1 % VA CREA
1.0000 | TOPICAL_CREAM | Freq: Every day | VAGINAL | 2 refills | Status: DC
Start: 2023-04-03 — End: 2023-04-09

## 2023-04-03 NOTE — Discharge Instructions (Signed)
You were seen for minor spotting. [place] in the MAU showed a pregnancy in your uterus with heartbeat of 136.  Your testing showed you have yeast infection. We sent in a yeast cream you can try for 7 days to help with your symptoms. If this is not working your outpatient office can prescribe a medication called diflucan to help treat the yeast.

## 2023-04-03 NOTE — MAU Provider Note (Signed)
History     CSN: [id number]  Arrival date and time: 04/03/23 1218   Event Date/Time   First Provider Initiated Contact with Patient 04/03/23 1517      Chief Complaint  Patient presents with   Abdominal Pain   Vaginal Bleeding   Vaginal Irritation   Abdominal Pain Pertinent negatives include no diarrhea, dysuria, fever, nausea or vomiting.  Vaginal Bleeding Associated symptoms include abdominal pain. Pertinent negatives include no back pain, chills, diarrhea, dysuria, fever, flank pain, nausea, rash, sore throat or vomiting.   Patient is 33 y.o. G3P1011 [redacted]w[redacted]d here with complaints of abdominal pain that is intermittent in nature. Reports some spotting starting Sunday, mostly with wiping and then noted one very small clot yesterday. She was scheduled for [place] today but did not attend this visit today. She was seen on 1/28 and treated for BV. Completed this treatment but reports worsening vaginal irritation and discharge.   Sure LMP of 12/22  denies LOF, VB, contractions, vaginal discharge.   OB History     Gravida  3   Para  1   Term  1   Preterm      AB  1   Living  1      SAB      IAB  1   Ectopic      Multiple  0   Live Births  1           Past Medical History:  Diagnosis Date   Headache(784.0)    Infection    UTI   Migraine with aura 05/13/2012   Second-degree perineal laceration, with delivery 01/20/2016    Past Surgical History:  Procedure Laterality Date   NO PAST SURGERIES      Family History  Problem Relation Age of Onset   Hepatitis C Father    Heart disease Maternal Grandmother    Diabetes Maternal Grandmother    Stroke Maternal Grandfather    Heart disease Paternal Grandmother    Stroke Paternal Grandfather     Social History   Tobacco Use   Smoking status: Never   Smokeless tobacco: Never  Substance Use Topics   Alcohol use: Not Currently    Alcohol/week: 3.0 standard drinks of alcohol    Types: 3 Standard drinks or  equivalent per week    Comment: Occasional, last 4/9   Drug use: Yes    Frequency: 7.0 times per week    Types: Marijuana    Comment: last used 03/17/2023    Allergies: No Known Allergies  Medications Prior to Admission  Medication Sig Dispense Refill Last Dose/Taking   Prenatal Vit-Fe Fumarate-FA (PRENATAL MULTIVITAMIN) TABS tablet Take 1 tablet by mouth every morning.        Review of Systems  Constitutional:  Negative for chills and fever.  HENT:  Negative for congestion and sore throat.   Eyes:  Negative for pain and visual disturbance.  Respiratory:  Negative for cough, chest tightness and shortness of breath.   Cardiovascular:  Negative for chest pain.  Gastrointestinal:  Positive for abdominal pain. Negative for diarrhea, nausea and vomiting.  Endocrine: Negative for cold intolerance and heat intolerance.  Genitourinary:  Positive for vaginal bleeding. Negative for dysuria and flank pain.  Musculoskeletal:  Negative for back pain.  Skin:  Negative for rash.  Allergic/Immunologic: Negative for food allergies.  Neurological:  Negative for dizziness and light-headedness.  Psychiatric/Behavioral:  Negative for agitation.    Physical Exam   Blood pressure 112/67, pulse 74,  temperature 98.1 F (36.7 C), temperature source Oral, resp. rate 18, height 5' (1.524 m), weight 65 kg, last menstrual period 02/13/2023, SpO2 100%, unknown if currently breastfeeding.  Physical Exam Vitals and nursing note reviewed.  Constitutional:      General: She is not in acute distress.    Appearance: She is well-developed.  HENT:     Head: Normocephalic and atraumatic.  Eyes:     General: No scleral icterus.    Conjunctiva/sclera: Conjunctivae normal.  Cardiovascular:     Rate and Rhythm: Normal rate.  Pulmonary:     Effort: Pulmonary effort is normal.  Chest:     Chest wall: No tenderness.  Abdominal:     Palpations: Abdomen is soft.     Tenderness: There is no abdominal tenderness.  There is no guarding or rebound.  Genitourinary:    Vagina: Normal.  Musculoskeletal:        General: Normal range of motion.     Cervical back: Normal range of motion and neck supple.  Skin:    General: Skin is warm and dry.     Findings: No rash.  Neurological:     Mental Status: She is alert and oriented to person, place, and time.     MAU Course  Pelvic US  Date/Time: 04/03/2023 3:45 PM  Performed by: Federico Flake, MD Authorized by: Federico Flake, MD   Procedure details:    Indications: evaluate for IUP     Assess:  Intrauterine pregnancy and fetal viability   Technique:  Transabdominal obstetric (HCG+) exam   Images: archived   Study Limitations: body habitus and bowel gas Uterine findings:    Intrauterine pregnancy: identified     Single gestation: identified     Gestational sac: identified     Fetal pole: identified     Fetal heart rate: identified (136)      Left ovary findings:    Left ovary:  Unable to visualize    Right ovary findings:     Right ovary:  Unable to visualize    Other findings:    Free pelvic fluid: not identified     Free peritoneal fluid: not identified   Comments:     Images in MAU note    MDM: high  This patient presents to the ED for concern of   Chief Complaint  Patient presents with   Abdominal Pain   Vaginal Bleeding   Vaginal Irritation     These complains involves an extensive number of treatment options, and is a complaint that carries with it a high risk of complications and morbidity.  The differential diagnosis for  1.abdominal pain in early pregnancy INCLUDES threatened miscarriage, ectopic pregnancy (unless IUP confirmed), pelvic infection, urinary tract infection or normal variant ligament related discomfort with live IUP.  Rare but possible DDX include appendicitis. Most likely for this patient is IUP with mostly likely vaginal irritation bleeding from yeast infection  Co morbidities that  complicate the patient evaluation:  External records from outside source obtained and reviewed including Scanned media records, CareEverywhere, and Prenatal care records  I ordered, and personally interpreted labs.  The pertinent results include:   Results for orders placed or performed during the hospital encounter of 04/03/23 (from the past 24 hours)  Wet prep, genital     Status: Abnormal   Collection Time: 04/03/23 12:57 PM   Specimen: Urine, Clean Catch  Result Value Ref Range   Yeast Wet Prep HPF POC PRESENT (A)  NONE SEEN   Trich, Wet Prep NONE SEEN NONE SEEN   Clue Cells Wet Prep HPF POC PRESENT (A) NONE SEEN   WBC, Wet Prep HPF POC >=10 (A) <10   Sperm NONE SEEN   Urinalysis, Routine w reflex microscopic -Urine, Clean Catch     Status: None   Collection Time: 04/03/23 12:57 PM  Result Value Ref Range   Color, Urine YELLOW YELLOW   APPearance CLEAR CLEAR   Specific Gravity, Urine 1.020 1.005 - 1.030   pH 6.5 5.0 - 8.0   Glucose, UA NEGATIVE NEGATIVE mg/dL   Hgb urine dipstick NEGATIVE NEGATIVE   Bilirubin Urine NEGATIVE NEGATIVE   Ketones, ur NEGATIVE NEGATIVE mg/dL   Protein, ur NEGATIVE NEGATIVE mg/dL   Nitrite NEGATIVE NEGATIVE   Leukocytes,Ua NEGATIVE NEGATIVE  ABO/Rh     Status: None   Collection Time: 04/03/23  1:22 PM  Result Value Ref Range   ABO/RH(D) A POS    No rh immune globuloin      NOT A RH IMMUNE GLOBULIN CANDIDATE, PT RH POSITIVE Performed at [facility], [address] [place], [place] [address]   CBC     Status: None   Collection Time: 04/03/23  1:33 PM  Result Value Ref Range   WBC 7.5 4.0 - 10.5 K/uL   RBC 4.58 3.87 - 5.11 MIL/uL   Hemoglobin 14.4 12.0 - 15.0 g/dL   HCT 60.4 54.0 - 98.1 %   MCV 90.6 80.0 - 100.0 fL   MCH 31.4 26.0 - 34.0 pg   MCHC 34.7 30.0 - 36.0 g/dL   RDW 19.1 47.8 - 29.5 %   Platelets 261 150 - 400 K/uL   nRBC 0.0 0.0 - 0.2 %  hCG, quantitative, pregnancy     Status: Abnormal   Collection Time: 04/03/23  1:33  PM  Result Value Ref Range   hCG, Beta Chain, Quant, S 133,707 (H) <5 mIU/mL    MAU Course: Updated on labs  Will treat for yeast  Confirmed IUP by BSUS   After the interventions noted above, I reevaluated the patient and found that they have :improved  Dispostion: discharged   Assessment and Plan   1. Bleeding in early pregnancy   2. Yeast infection   3. Cramping affecting pregnancy, antepartum   4. [redacted] weeks gestation of pregnancy    - IUP on bedside US, + fetal HR. DID NOT date pregnancy. Patient has sure LMP - Treat for Yeast with clotrimazole - Reviewed return precautions  Allergies as of 04/03/2023   No Known Allergies      Medication List     TAKE these medications    clotrimazole 1 % vaginal cream Commonly known as: GYNE-LOTRIMIN Place 1 Applicatorful vaginally at bedtime.   prenatal multivitamin Tabs tablet Take 1 tablet by mouth every morning.         Isa Rankin [facility] 04/03/2023, 3:56 PM

## 2023-04-03 NOTE — Telephone Encounter (Signed)
Patient called wanting to start prenatal care. Got patient scheduled but also informed her of her u/s that is scheduled for today. Patient is aware of appt but says that she will not be going. After explaining to the patient that it is a dating u/s to figure out how far along she is patient says that she still will not be going because it is a waste of her time.

## 2023-04-03 NOTE — MAU Note (Signed)
Marie Hampton is a 33 y.o. at [redacted]w[redacted]d here in MAU reporting: she's having abdominal cramping and spotting.  Reports abdominal pain is sharp and intermittent.  Also c/o vaginal irritation despite completing prescribed meds for BV. Reports vaginal discharge is thick, white and without odor.  LMP: NA Onset of complaint: Sunday Pain score: 5 Vitals:   04/03/23 1236  BP: 112/67  Pulse: 74  Resp: 18  Temp: 98.1 F (36.7 C)  SpO2: 100%     FHT: NA  Lab orders placed from triage: UA, Wet prep & GC/Chlamydia

## 2023-04-04 ENCOUNTER — Other Ambulatory Visit: Payer: Self-pay

## 2023-04-04 LAB — GC/CHLAMYDIA PROBE AMP (~~LOC~~) NOT AT ARMC
Chlamydia: NEGATIVE
Comment: NEGATIVE
Comment: NORMAL
Neisseria Gonorrhea: NEGATIVE

## 2023-04-09 ENCOUNTER — Other Ambulatory Visit: Payer: Self-pay | Admitting: Family Medicine

## 2023-04-09 DIAGNOSIS — B379 Candidiasis, unspecified: Secondary | ICD-10-CM

## 2023-04-09 MED ORDER — TERCONAZOLE 0.4 % VA CREA: 1.0000 | TOPICAL_CREAM | Freq: Every day | VAGINAL | 0 refills | Status: DC

## 2023-04-09 NOTE — Progress Notes (Signed)
Call from pt to MAU because she has been unable to get Clotrimazole filled. Terazol sent to pt's pharmacy. Pt called by RN to update pt.   Sundra Aland, MD OB Fellow, Faculty [facility], Center for [facility]  04/09/23 4:21 PM

## 2023-04-24 ENCOUNTER — Telehealth: Payer: Medicaid Other

## 2023-04-24 NOTE — Progress Notes (Deleted)
 Patient no showed new OB intake appointment on 04/24/2023.   Marie Hampton, CMA

## 2023-04-25 NOTE — Progress Notes (Signed)
 Patient did not show for new ob intake.

## 2023-05-01 ENCOUNTER — Encounter: Payer: No Typology Code available for payment source | Admitting: Obstetrics and Gynecology

## 2023-05-08 ENCOUNTER — Encounter: Admitting: Obstetrics and Gynecology

## 2023-11-19 ENCOUNTER — Encounter (HOSPITAL_COMMUNITY): Payer: Self-pay | Admitting: Obstetrics and Gynecology

## 2023-11-19 ENCOUNTER — Inpatient Hospital Stay (HOSPITAL_COMMUNITY)
Admission: AD | Admit: 2023-11-19 | Discharge: 2023-11-21 | DRG: 807 | Disposition: A | Discharge status: Home / Self Care | Attending: Obstetrics and Gynecology | Admitting: Obstetrics and Gynecology

## 2023-11-19 ENCOUNTER — Inpatient Hospital Stay (HOSPITAL_COMMUNITY): Admitting: Anesthesiology

## 2023-11-19 ENCOUNTER — Other Ambulatory Visit: Payer: Self-pay

## 2023-11-19 DIAGNOSIS — Z8249 Family history of ischemic heart disease and other diseases of the circulatory system: Secondary | ICD-10-CM | POA: Diagnosis not present

## 2023-11-19 DIAGNOSIS — Z833 Family history of diabetes mellitus: Secondary | ICD-10-CM | POA: Diagnosis not present

## 2023-11-19 DIAGNOSIS — Z3A39 39 weeks gestation of pregnancy: Secondary | ICD-10-CM | POA: Diagnosis not present

## 2023-11-19 DIAGNOSIS — O9902 Anemia complicating childbirth: Secondary | ICD-10-CM | POA: Diagnosis present

## 2023-11-19 DIAGNOSIS — O26893 Other specified pregnancy related conditions, third trimester: Secondary | ICD-10-CM | POA: Diagnosis present

## 2023-11-19 LAB — CBC
HCT: 38.6 % (ref 36.0–46.0)
Hemoglobin: 12.6 g/dL (ref 12.0–15.0)
MCH: 27.5 pg (ref 26.0–34.0)
MCHC: 32.6 g/dL (ref 30.0–36.0)
MCV: 84.3 fL (ref 80.0–100.0)
Platelets: 283 K/uL (ref 150–400)
RBC: 4.58 MIL/uL (ref 3.87–5.11)
RDW: 14.6 % (ref 11.5–15.5)
WBC: 12 K/uL — ABNORMAL HIGH (ref 4.0–10.5)
nRBC: 0 % (ref 0.0–0.2)

## 2023-11-19 LAB — TYPE AND SCREEN
ABO/RH(D): A POS
Antibody Screen: NEGATIVE

## 2023-11-19 LAB — RPR: RPR Ser Ql: NONREACTIVE

## 2023-11-19 MED ORDER — DIPHENHYDRAMINE HCL 25 MG PO CAPS: 25.0000 mg | ORAL_CAPSULE | Freq: Four times a day (QID) | ORAL | Status: DC | PRN

## 2023-11-19 MED ORDER — DIBUCAINE (PERIANAL) 1 % EX OINT: 1.0000 | TOPICAL_OINTMENT | CUTANEOUS | Status: DC | PRN

## 2023-11-19 MED ORDER — DIPHENHYDRAMINE HCL 50 MG/ML IJ SOLN: 12.5000 mg | INTRAMUSCULAR | Status: DC | PRN

## 2023-11-19 MED ORDER — ZOLPIDEM TARTRATE 5 MG PO TABS: 5.0000 mg | ORAL_TABLET | Freq: Every evening | ORAL | Status: DC | PRN

## 2023-11-19 MED ORDER — OXYTOCIN-SODIUM CHLORIDE 30-0.9 UT/500ML-% IV SOLN: 2.5000 [IU]/h | INTRAVENOUS | Status: DC

## 2023-11-19 MED ORDER — COCONUT OIL OIL
1.0000 | TOPICAL_OIL | Status: DC | PRN
  Administered 2023-11-21: 1 via TOPICAL

## 2023-11-19 MED ORDER — PHENYLEPHRINE 80 MCG/ML (10ML) SYRINGE FOR IV PUSH (FOR BLOOD PRESSURE SUPPORT): 80.0000 ug | PREFILLED_SYRINGE | INTRAVENOUS | Status: DC | PRN

## 2023-11-19 MED ORDER — LACTATED RINGERS IV SOLN: 500.0000 mL | Freq: Once | INTRAVENOUS | Status: DC

## 2023-11-19 MED ORDER — ONDANSETRON HCL 4 MG/2ML IJ SOLN: 4.0000 mg | INTRAMUSCULAR | Status: DC | PRN

## 2023-11-19 MED ORDER — OXYCODONE-ACETAMINOPHEN 5-325 MG PO TABS: 2.0000 | ORAL_TABLET | ORAL | Status: DC | PRN

## 2023-11-19 MED ORDER — SOD CITRATE-CITRIC ACID 500-334 MG/5ML PO SOLN: 30.0000 mL | ORAL | Status: DC | PRN

## 2023-11-19 MED ORDER — ACETAMINOPHEN 325 MG PO TABS: 650.0000 mg | ORAL_TABLET | ORAL | Status: DC | PRN

## 2023-11-19 MED ORDER — EPHEDRINE 5 MG/ML INJ: 10.0000 mg | INTRAVENOUS | Status: DC | PRN

## 2023-11-19 MED ORDER — BENZOCAINE-MENTHOL 20-0.5 % EX AERO
1.0000 | INHALATION_SPRAY | CUTANEOUS | Status: DC | PRN
  Administered 2023-11-20: 1 via TOPICAL
  Filled 2023-11-19 (×2): qty 56

## 2023-11-19 MED ORDER — FENTANYL CITRATE (PF) 100 MCG/2ML IJ SOLN
100.0000 ug | INTRAMUSCULAR | Status: DC | PRN
  Administered 2023-11-19 (×2): 100 ug via INTRAVENOUS
  Filled 2023-11-19: qty 2

## 2023-11-19 MED ORDER — ACETAMINOPHEN 325 MG PO TABS
650.0000 mg | ORAL_TABLET | ORAL | Status: DC | PRN
  Administered 2023-11-21: 650 mg via ORAL
  Filled 2023-11-19: qty 2

## 2023-11-19 MED ORDER — WITCH HAZEL-GLYCERIN EX PADS: 1.0000 | MEDICATED_PAD | CUTANEOUS | Status: DC | PRN

## 2023-11-19 MED ORDER — FENTANYL CITRATE (PF) 100 MCG/2ML IJ SOLN
INTRAMUSCULAR | Status: AC
  Filled 2023-11-19: qty 2

## 2023-11-19 MED ORDER — LACTATED RINGERS IV SOLN: 500.0000 mL | INTRAVENOUS | Status: DC | PRN

## 2023-11-19 MED ORDER — ONDANSETRON HCL 4 MG/2ML IJ SOLN: 4.0000 mg | Freq: Four times a day (QID) | INTRAMUSCULAR | Status: DC | PRN

## 2023-11-19 MED ORDER — SIMETHICONE 80 MG PO CHEW: 80.0000 mg | CHEWABLE_TABLET | ORAL | Status: DC | PRN

## 2023-11-19 MED ORDER — LIDOCAINE HCL (PF) 1 % IJ SOLN
INTRAMUSCULAR | Status: DC | PRN
  Administered 2023-11-19: 10 mL via EPIDURAL

## 2023-11-19 MED ORDER — OXYCODONE-ACETAMINOPHEN 5-325 MG PO TABS: 1.0000 | ORAL_TABLET | ORAL | Status: DC | PRN

## 2023-11-19 MED ORDER — TETANUS-DIPHTH-ACELL PERTUSSIS 5-2.5-18.5 LF-MCG/0.5 IM SUSY: 0.5000 mL | PREFILLED_SYRINGE | Freq: Once | INTRAMUSCULAR | Status: DC

## 2023-11-19 MED ORDER — SENNOSIDES-DOCUSATE SODIUM 8.6-50 MG PO TABS
2.0000 | ORAL_TABLET | Freq: Every day | ORAL | Status: DC
  Administered 2023-11-20 – 2023-11-21 (×2): 2 via ORAL
  Filled 2023-11-19 (×2): qty 2

## 2023-11-19 MED ORDER — OXYTOCIN BOLUS FROM INFUSION: 333.0000 mL | Freq: Once | INTRAVENOUS | Status: DC

## 2023-11-19 MED ORDER — LIDOCAINE HCL (PF) 1 % IJ SOLN: 30.0000 mL | INTRAMUSCULAR | Status: DC | PRN

## 2023-11-19 MED ORDER — ONDANSETRON HCL 4 MG PO TABS: 4.0000 mg | ORAL_TABLET | ORAL | Status: DC | PRN

## 2023-11-19 MED ORDER — PRENATAL MULTIVITAMIN CH
1.0000 | ORAL_TABLET | Freq: Every day | ORAL | Status: DC
  Administered 2023-11-20 – 2023-11-21 (×2): 1 via ORAL
  Filled 2023-11-19 (×2): qty 1

## 2023-11-19 MED ORDER — FLEET ENEMA RE ENEM: 1.0000 | ENEMA | RECTAL | Status: DC | PRN

## 2023-11-19 MED ORDER — IBUPROFEN 600 MG PO TABS
600.0000 mg | ORAL_TABLET | Freq: Four times a day (QID) | ORAL | Status: DC
  Administered 2023-11-19 – 2023-11-21 (×7): 600 mg via ORAL
  Filled 2023-11-19 (×7): qty 1

## 2023-11-19 MED ORDER — FENTANYL-BUPIVACAINE-NACL 0.5-0.125-0.9 MG/250ML-% EP SOLN
12.0000 mL/h | EPIDURAL | Status: DC | PRN
  Administered 2023-11-19: 12 mL/h via EPIDURAL
  Filled 2023-11-19: qty 250

## 2023-11-19 MED ORDER — LACTATED RINGERS IV SOLN: INTRAVENOUS | Status: DC

## 2023-11-19 NOTE — Anesthesia Preprocedure Evaluation (Signed)
 Anesthesia Evaluation  Patient identified by MRN, date of birth, ID band Patient awake    Reviewed: Allergy & Precautions, NPO status , Patient's Chart, lab work & pertinent test results  Airway Mallampati: II  TM Distance: >3 FB Neck ROM: Full    Dental no notable dental hx.    Pulmonary asthma    Pulmonary exam normal breath sounds clear to auscultation       Cardiovascular negative cardio ROS Normal cardiovascular exam Rhythm:Regular Rate:Normal     Neuro/Psych  Headaches  negative psych ROS   GI/Hepatic negative GI ROS,,,(+)     substance abuse  marijuana use  Endo/Other  negative endocrine ROS    Renal/GU negative Renal ROS  negative genitourinary   Musculoskeletal negative musculoskeletal ROS (+)    Abdominal   Peds  Hematology negative hematology ROS (+)   Anesthesia Other Findings Presents in labor  Reproductive/Obstetrics (+) Pregnancy                              Anesthesia Physical Anesthesia Plan  ASA: 2  Anesthesia Plan: Epidural   Post-op Pain Management:    Induction:   PONV Risk Score and Plan: Treatment may vary due to age or medical condition  Airway Management Planned: Natural Airway  Additional Equipment:   Intra-op Plan:   Post-operative Plan:   Informed Consent: I have reviewed the patients History and Physical, chart, labs and discussed the procedure including the risks, benefits and alternatives for the proposed anesthesia with the patient or authorized representative who has indicated his/her understanding and acceptance.       Plan Discussed with: Anesthesiologist  Anesthesia Plan Comments: (Patient identified. Risks, benefits, options discussed with patient including but not limited to bleeding, infection, nerve damage, paralysis, failed block, incomplete pain control, headache, blood pressure changes, nausea, vomiting, reactions to  medication, itching, and post partum back pain. Confirmed with bedside nurse the patient's most recent platelet count. Confirmed with the patient that they are not taking any anticoagulation, have any bleeding history or any family history of bleeding disorders. Patient expressed understanding and wishes to proceed. All questions were answered. )        Anesthesia Quick Evaluation

## 2023-11-19 NOTE — H&P (Addendum)
 OB ADMISSION/ HISTORY & PHYSICAL:  Admission Date: 11/19/2023  9:18 AM  Admit Diagnosis: CTX    Marie Hampton is a 33 y.o. female G3P1011 at [redacted]w[redacted]d presenting for labor. States contractions since 0400, now 5 minutes apart. Denies leaking of fluid or vaginal bleeding. Endorses + fetal movement. Partner present and supportive. Eagerly anticipating a baby girl [place]'.   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, + UDS at NOB on 05/03/23 Bilateral carpal tunnel, uses wrist splints  Prenatal Labs: ABO, Rh:   A POS Antibody:   Negative Rubella:   Immune RPR:   Non-reactive HBsAg:   Negative HIV:   Negative GBS:   Negative 1 hr Glucola : 92 Genetic Screening: Declined Ultrasound: normal XX anatomy    Maternal Diabetes: No Genetic Screening: Declined Maternal Ultrasounds/Referrals: Normal Fetal Ultrasounds or other Referrals:  None Maternal Substance Abuse:  Yes:  Type: Marijuana Significant Maternal Medications:  None Significant Maternal Lab Results:  Group B Strep negative Other Comments:  None  Medical / Surgical History : Past medical history:  Past Medical History:  Diagnosis Date   Headache(784.0)    Infection    UTI   Migraine with aura 05/13/2012   Pregnancy 06/21/2015   Second-degree perineal laceration, with delivery 01/20/2016    Past surgical history:  Past Surgical History:  Procedure Laterality Date   NO PAST SURGERIES      Family History:  Family History  Problem Relation Age of Onset   Hepatitis C Father    Heart disease Maternal Grandmother    Diabetes Maternal Grandmother    Stroke Maternal Grandfather    Heart disease Paternal Grandmother    Stroke Paternal Grandfather     Social History:  reports that she has never smoked. She has never used smokeless tobacco. She reports that she does not currently use alcohol after a past usage of  about 3.0 standard drinks of alcohol per week. She reports current drug use. Frequency: 7.00 times per week. Drug: Marijuana.  Allergies: Patient has no known allergies.   Current Medications at time of admission:  Medications Prior to Admission  Medication Sig Dispense Refill Last Dose/Taking   Prenatal Vit-Fe Fumarate-FA (PRENATAL MULTIVITAMIN) TABS tablet Take 1 tablet by mouth every morning.    11/17/2023   terconazole  (TERAZOL 7 ) 0.4 % vaginal cream Place 1 applicator vaginally at bedtime. Use for seven days 45 g 0     Review of Systems: Review of Systems  All other systems reviewed and are negative.  Physical Exam: Vital signs and nursing notes reviewed.  Patient Vitals for the past 24 hrs:  BP Temp Temp src Pulse Resp SpO2 Height Weight  11/19/23 1032 131/79 -- -- 93 18 99 % -- --  11/19/23 1001 -- -- -- -- -- 99 % -- --  11/19/23 0952 125/77 98 F (36.7 C) Oral 86 18 99 % 5' (1.524 m) 83.8 kg    General: AAO x 3, NAD Heart: RRR Lungs:CTAB Abdomen: Gravid, NT Extremities: no edema SVE: Dilation: 4 Effacement (%): 90 Station: -1 Presentation: Vertex Exam by:: F. Morris, RNC   FHR: 130BPM, moderate variability, + accels, no decels TOCO: Contractions q 3-4 minutes  Labs:   No results for input(s): WBC, HGB, HCT, PLT in the last 72 hours.  Assessment/Plan: 33 y.o. G3P1011 at [redacted]w[redacted]d, early labor  Fetal wellbeing - FHT category 1 EFW AGA 7-8lbs  Labor: Plan AROM, Pitocin  prn  GBS negative Rubella immune Rh positive  Pain control: desires unmedicated birth, open to epidural Analgesia/anesthesia PRN  Anticipated MOD: NSVB  POC discussed with patient and support team, all questions answered.  Dr. Henry notified of admission/plan of care.  Alan MARLA Molt CNM, MSN 11/19/2023, 10:50 AM

## 2023-11-19 NOTE — Anesthesia Procedure Notes (Signed)
 Epidural Patient location during procedure: OB Start time: 11/19/2023 5:05 PM End time: 11/19/2023 5:15 PM  Staffing Anesthesiologist: Niels Marien CROME, MD Performed: anesthesiologist   Preanesthetic Checklist Completed: patient identified, IV checked, risks and benefits discussed, monitors and equipment checked, pre-op evaluation and timeout performed  Epidural Patient position: sitting Prep: DuraPrep and site prepped and draped Patient monitoring: continuous pulse ox, blood pressure, heart rate and cardiac monitor Approach: midline Location: L3-L4 Injection technique: LOR air  Needle:  Needle insertion depth: 5.5 cm Needle type: Tuohy  Needle gauge: 17 G Needle length: 9 cm Needle insertion depth: 5.5 cm Catheter type: closed end flexible Catheter size: 19 Gauge Catheter at skin depth: 11 cm Test dose: negative  Assessment Sensory level: T8 Events: blood not aspirated, no cerebrospinal fluid, injection not painful, no injection resistance, no paresthesia and negative IV test  Additional Notes Patient identified. Risks/Benefits/Options discussed with patient including but not limited to bleeding, infection, nerve damage, paralysis, failed block, incomplete pain control, headache, blood pressure changes, nausea, vomiting, reactions to medication both or allergic, itching and postpartum back pain. Confirmed with bedside nurse the patient's most recent platelet count. Confirmed with patient that they are not currently taking any anticoagulation, have any bleeding history or any family history of bleeding disorders. Patient expressed understanding and wished to proceed. All questions were answered. Sterile technique was used throughout the entire procedure. Please see nursing notes for vital signs. Test dose was given through epidural catheter and negative prior to continuing to dose epidural or start infusion. Warning signs of high block given to the patient including shortness of  breath, tingling/numbness in hands, complete motor block, or any concerning symptoms with instructions to call for help. Patient was given instructions on fall risk and not to get out of bed. All questions and concerns addressed with instructions to call with any issues or inadequate analgesia.  Reason for block:procedure for pain

## 2023-11-19 NOTE — Progress Notes (Signed)
 S: Comfortable with epidural. Family at the bedside. Declines AROM for augmentation.   O: Vitals:   11/19/23 1730 11/19/23 1731 11/19/23 1735 11/19/23 1736  BP:  135/67  135/65  Pulse:  93  91  Resp:  18  18  Temp:      TempSrc:      SpO2: 100%  100%   Weight:      Height:       FHT:  FHR: 135 bpm, variability: minimal ,  accelerations:  Present,  decelerations:  Absent UC:   irregular, every 5-6 minutes SVE:   Dilation: 5 Effacement (%): 90 Station: -1 Exam by:: Marie Me RN  A / P: Protracted latent phase  Fetal Wellbeing:  Category I GBS: Negative Pain Control:  Epidural Anticipated MOD:  NSVD  Declines AROM for augmentation. Will reassess SVE at 1930.   Marie Hampton, CNM, MSN 11/19/2023, 5:46 PM

## 2023-11-19 NOTE — MAU Note (Signed)
 Marie Hampton is a 33 y.o. at [redacted]w[redacted]d here in MAU reporting: she's having ctxs that are 5 minutes apart since 0400 this morning.  Denies LOF, has seen bloody show and mucous plug.  Endorses +FM.  LMP:  Onset of complaint: last night Pain score: 7 Vitals:   11/19/23 0952  BP: 125/77  Pulse: 86  Resp: 18  Temp: 98 F (36.7 C)  SpO2: 99%     FHT: 140 bpm  Lab orders placed from triage: None

## 2023-11-20 ENCOUNTER — Encounter (HOSPITAL_COMMUNITY): Payer: Self-pay | Admitting: Obstetrics and Gynecology

## 2023-11-20 DIAGNOSIS — O9902 Anemia complicating childbirth: Secondary | ICD-10-CM | POA: Diagnosis not present

## 2023-11-20 LAB — CBC
HCT: 29.6 % — ABNORMAL LOW (ref 36.0–46.0)
Hemoglobin: 9.8 g/dL — ABNORMAL LOW (ref 12.0–15.0)
MCH: 27.5 pg (ref 26.0–34.0)
MCHC: 33.1 g/dL (ref 30.0–36.0)
MCV: 83.1 fL (ref 80.0–100.0)
Platelets: 235 K/uL (ref 150–400)
RBC: 3.56 MIL/uL — ABNORMAL LOW (ref 3.87–5.11)
RDW: 14.8 % (ref 11.5–15.5)
WBC: 12.8 K/uL — ABNORMAL HIGH (ref 4.0–10.5)
nRBC: 0 % (ref 0.0–0.2)

## 2023-11-20 MED ORDER — POLYSACCHARIDE IRON COMPLEX 150 MG PO CAPS
150.0000 mg | ORAL_CAPSULE | Freq: Every day | ORAL | Status: DC
  Administered 2023-11-20 – 2023-11-21 (×2): 150 mg via ORAL
  Filled 2023-11-20 (×2): qty 1

## 2023-11-20 NOTE — Progress Notes (Signed)
 PPD #1 S/P NSVD  Live born female  Birth Weight: 6 lb 15.1 oz (3150 g) APGAR: 8, 9  Newborn Delivery   Birth date/time: 11/19/2023 20:29:41 Delivery type: Vaginal, Spontaneous    Baby name: Marie Hampton  Delivering provider: JOSHUA PALMA K  Lacerations: 1st degree  Feeding: breast  Pain control at delivery: Epidural  S:  Reports feeling well. Happy with birth experience.              Tolerating PO/No nausea or vomiting             Bleeding is light             Pain controlled with acetaminophen  and ibuprofen  (OTC)             Up ad lib/ambulatory/voiding without difficulties   O:  A & O x 3, in no apparent distress  Vitals:   11/19/23 2218 11/19/23 2238 11/19/23 2353 11/20/23 0345  BP: 133/70 119/69 116/68 98/64  Pulse: (!) 111 98 (!) 114 95  Resp:  18 17 18   Temp:  99.4 F (37.4 C) 99 F (37.2 C) 98.4 F (36.9 C)  TempSrc:  Oral Oral Oral  SpO2:  98% 99% 99%  Weight:      Height:       Recent Labs    11/19/23 1047 11/20/23 0512  WBC 12.0* 12.8*  HGB 12.6 9.8*  HCT 38.6 29.6*  PLT 283 235    Blood type: --/--/A POS (09/29 1038)  Rubella:     I&O: No intake/output data recorded.          Total I/O In: -  Out: 571 [Urine:450; Blood:121]  Gen: AAO x 3, NAD Abdomen: soft, non-tender, non-distended Fundus: firm, non-tender, U-1 Perineum: repair intact Lochia: small Extremities: no edema, no calf pain or tenderness   A/P:  PPD # 1 33 y.o., [id number]  Principal Problem:   Postpartum care following vaginal delivery 9/29  Doing well - stable status  Routine post partum orders Active Problems:   Normal labor   SVD (spontaneous vaginal delivery)   First degree perineal laceration  Discussed perineal care and comfort measures.    Maternal anemia, with delivery  Asymptomatic, not clinically significant  Start PO iron daily  Anticipate discharge tomorrow.   PALMA MARLA JOSHUA, MSN, CNM 11/20/2023, 6:20 AM

## 2023-11-20 NOTE — Clinical Social Work Maternal (Signed)
 CLINICAL SOCIAL WORK MATERNAL/CHILD NOTE  Patient Details  Name: Marie Hampton MRN: [id number] Date of Birth: 10/28/1990  Date:  11/20/2023  Clinical Social Worker Initiating Note:  Jaspreet Hollings Date/Time: Initiated:  11/20/23/1537     Child's Name:  Marie Hampton 11/19/2023   Biological Parents:  Mother, Father [name] [birth year] 1991, Pasquale Hampton 10/30/1989)   Need for Interpreter:  None   Reason for Referral:  Current Substance Use/Substance Use During Pregnancy     Address:  [address] [place] [address]    Phone number:  [phone] (home)     Additional phone number:   Household Members/Support Persons (HM/SP):   Household Member/Support Person 1, Household Member/Support Person 2   HM/SP Name Relationship DOB or Age  HM/SP -1 Pasquale Hampton FOB 10/30/1989  HM/SP -2 [name] daughter 7  HM/SP -3        HM/SP -4        HM/SP -5        HM/SP -6        HM/SP -7        HM/SP -8          Natural Supports (not living in the home):  Extended Family   Professional Supports: None   Employment: Self-employed   Type of Work:     Education:  Counsellor arranged:    Surveyor, quantity Resources:  [employer]   Other Resources:  Sales executive     Cultural/Religious Considerations Which May Impact Care:    Strengths:  Ability to meet basic needs  , Home prepared for child  , Pediatrician chosen   Psychotropic Medications:         Pediatrician:    Armed forces operational officer area  Pediatrician List:   [facility]  [place]    [place]    [place] [facility]      Pediatrician Fax Number:    Risk Factors/Current Problems:  None   Cognitive State:  Able to Concentrate     Mood/Affect:  Calm  , Comfortable     CSW Assessment: CSW received a consult for MOB THC use during pregnancy. CSW entered the room and complete assessment and offer support. CSW entered the room and  observed MOB resting in bed holding the infant. CSW introduced self, CSW role and reason for visit, MOB was agreeable to visit. CSW inquired about how MOB was feeling, MOB reported good. CSW confirmed MOB demographic information, MOB verified the information on fie was correct. CSW inquired about MOB MH hx MOB being any MH concerns. CSW provided education regarding the baby blues period vs. perinatal mood disorders, discussed treatment and gave resources for mental health follow up if concerns arise.  CSW recommends self-evaluation during the postpartum time period using the New Mom Checklist from Postpartum Progress and encouraged MOB to contact a medical professional if symptoms are noted at any time.  MOB identified her family as her support.   CSW inquired about MOB noted THC use, MOB  reported she stopped once she found out she was pregnant. CSW informed MOB of the hospital drug screen policy, MOB verbalized understanding. CSW informed MOB infant UDS and CDS would be collected, MOB verbalized understanding.   CSW provided review of Sudden Infant Death Syndrome (SIDS) precautions.  MOB reported she has all essential items for the infant including a bassinet, crib and car seat. MOB scheduled with [facility]  for 10/14 @ 11am.  CSW identifies no further need for intervention and no barriers to discharge at this time.  CSW Plan/Description:  No Further Intervention Required/No Barriers to Discharge, Sudden Infant Death Syndrome (SIDS) Education, Perinatal Mood and Anxiety Disorder (PMADs) Education, Hospital Drug Screen Policy Information, CSW Will Continue to Monitor Umbilical Cord Tissue Drug Screen Results and Make Report if Warranted, Other Information/Referral to [employer], [place] 11/20/2023, 3:40 PM

## 2023-11-20 NOTE — Lactation Note (Signed)
 This note was copied from a baby's chart. Lactation Consultation Note  Patient Name: Marie Hampton Unijb'd Date: 11/20/2023 Age:32 hours Reason for consult: Initial assessment;Term  P2. Mom states the baby has been BF well. Mom isn't having trouble BF but does wish the baby would open her mouth wider. Suggested touching top lip w/nipple. Mom hand expressed colostrum to tip of nipple to stimulate baby to feed, then stroked baby's lips w/colostrum and she opened wide for good latch. Praised mom. Heard swallows right away. Praised mom. Newborn feeding habits, STS, I&O, body alignment, support, positioning reviewed. Mom encouraged to feed baby 8-12 times/24 hours and with feeding cues.  Encouraged mom to call for assistance as needed. Maternal Data Has patient been taught Hand Expression?: Yes Does the patient have breastfeeding experience prior to this delivery?: Yes How long did the patient breastfeed?: 7 months to her 33 yr old  Feeding    LATCH Score Latch: Grasps breast easily, tongue down, lips flanged, rhythmical sucking.  Audible Swallowing: Spontaneous and intermittent  Type of Nipple: Everted at rest and after stimulation  Comfort (Breast/Nipple): Soft / non-tender  Hold (Positioning): Assistance needed to correctly position infant at breast and maintain latch.  LATCH Score: 9   Lactation Tools Discussed/Used    Interventions Interventions: Breast feeding basics reviewed;Assisted with latch;Skin to skin;Adjust position;Breast compression;Support pillows;Position options;Education;LC Services brochure  Discharge Pump: DEBP  Consult Status Consult Status: Follow-up Date: 11/20/23 Follow-up type: In-patient    [name] 11/20/2023, 3:42 AM

## 2023-11-20 NOTE — Lactation Note (Signed)
 This note was copied from a baby's chart. Lactation Consultation Note  Patient Name: Marie Hampton Unijb'd Date: 11/20/2023 Age:32 hours, experienced breast feeding mother x 1  Reason for consult: Follow-up assessment;Term;Infant weight loss (1 % weight loss,) Per mom the baby recently had a bath and has been STS. Baby awake for short time when LC in the room and mom attempted and she fell back to sleep.  Baby has fed this am.   Maternal Data Has patient been taught Hand Expression?: Yes Does the patient have breastfeeding experience prior to this delivery?: Yes  Feeding Mother's Current Feeding Choice: Breast Milk  LATCH Score Latch: Too sleepy or reluctant, no latch achieved, no sucking elicited.  Audible Swallowing: None  Type of Nipple: Everted at rest and after stimulation  Comfort (Breast/Nipple): Soft / non-tender  Hold (Positioning): No assistance needed to correctly position infant at breast.  LATCH Score: 6      Interventions Interventions: Breast feeding basics reviewed;Skin to skin;Education;LC Services brochure;CDC milk storage guidelines;CDC Guidelines for Breast Pump Cleaning  Discharge Pump: DEBP;Personal  Consult Status Consult Status: Follow-up Date: 11/21/23 Follow-up type: In-patient    Rollene Caldron Kire Ferg 11/20/2023, 11:06 AM

## 2023-11-20 NOTE — Anesthesia Postprocedure Evaluation (Signed)
 Anesthesia Post Note  Patient: Marie Hampton  Procedure(s) Performed: AN AD HOC LABOR EPIDURAL     Patient location during evaluation: Mother Baby Anesthesia Type: Epidural Level of consciousness: awake Pain management: satisfactory to patient Vital Signs Assessment: post-procedure vital signs reviewed and stable Respiratory status: spontaneous breathing Cardiovascular status: stable Anesthetic complications: no   No notable events documented.  Last Vitals:  Vitals:   11/19/23 2353 11/20/23 0345  BP: 116/68 98/64  Pulse: (!) 114 95  Resp: 17 18  Temp: 37.2 C 36.9 C  SpO2: 99% 99%    Last Pain:  Vitals:   11/20/23 0535  TempSrc:   PainSc: 3    Pain Goal:                   JEANENNE HANDING

## 2023-11-21 ENCOUNTER — Other Ambulatory Visit (HOSPITAL_COMMUNITY): Payer: Self-pay

## 2023-11-21 LAB — IRON: Iron: 38 ug/dL (ref 28–170)

## 2023-11-21 MED ORDER — IBUPROFEN 600 MG PO TABS
600.0000 mg | ORAL_TABLET | Freq: Four times a day (QID) | ORAL | 0 refills | Status: AC
  Filled 2023-11-21: qty 30, 8d supply, fill #0

## 2023-11-21 MED ORDER — IRON 325 (65 FE) MG PO TABS
1.0000 | ORAL_TABLET | ORAL | 0 refills | Status: AC
  Filled 2023-11-21: qty 15, 30d supply, fill #0

## 2023-11-21 MED ORDER — IBUPROFEN 600 MG PO TABS: 600.0000 mg | ORAL_TABLET | Freq: Four times a day (QID) | ORAL | 0 refills | Status: DC

## 2023-11-21 MED ORDER — IRON 325 (65 FE) MG PO TABS: 1.0000 | ORAL_TABLET | ORAL | 0 refills | Status: DC

## 2023-11-21 NOTE — Lactation Note (Signed)
 This note was copied from a baby's chart. Lactation Consultation Note  Patient Name: Marie Hampton Unijb'd Date: 11/21/2023 Age:32 hours Reason for consult: Follow-up assessment (attempted to see dyad and mom in the bathroom , will check with the mom)    Feeding Mother's Current Feeding Choice: Breast Milk     Consult Status Consult Status: Follow-up Date: 11/21/23 Follow-up type: In-patient    Rollene Caldron Lareina Espino 11/21/2023, 10:55 AM

## 2023-11-21 NOTE — Lactation Note (Signed)
 This note was copied from a baby's chart. Lactation Consultation Note  Patient Name: Marie Hampton Date: 11/21/2023 Age:32 hours, P2 , experienced breast feeder  Reason for consult: Follow-up assessment;Term;Infant weight loss;Breastfeeding assistance Per mom the baby has been cluster feeding.  LC reviewed the breast feeding goals for 24 hours - feed with cues and by 3 hours offer the breast STS until the baby is back to birth weight and gaining well.  Baby wide awake and mom trying to eat her lunch. LC offered to assist with latch , football so mom could eat her lunch while breast feeding .  Baby latched with dept, multiple swallows and increased with breast compressions and per mom comfortable. The Nipple was well rounded when the baby released.  LC reviewed engorgement prevention and tx. Storage of breastmilk, nutritive vs non- nutritive feeding patterns and the importance of watching the baby for hanging out latched.  LC provided a hand pump with instructions.   Maternal Data Has patient been taught Hand Expression?: Yes Does the patient have breastfeeding experience prior to this delivery?: Yes  Feeding Mother's Current Feeding Choice: Breast Milk  LATCH Score Latch: Grasps breast easily, tongue down, lips flanged, rhythmical sucking.  Audible Swallowing: Spontaneous and intermittent  Type of Nipple: Everted at rest and after stimulation  Comfort (Breast/Nipple): Filling, red/small blisters or bruises, mild/mod discomfort  Hold (Positioning): Assistance needed to correctly position infant at breast and maintain latch.  LATCH Score: 8   Lactation Tools Discussed/Used Tools: Pump;Flanges Flange Size: 18;Other (comment) (mom aware to order flnage inserts) Breast pump type: Manual Pump Education: Setup, frequency, and cleaning;Milk Storage Reason for Pumping: PRN  Interventions  Education   Discharge Discharge Education: Engorgement and breast care;Warning  signs for feeding baby Pump: Personal;DEBP;Manual WIC Program: No  Consult Status Consult Status: Complete Date: 11/21/23 Follow-up type: In-patient    Rollene Caldron [name] 11/21/2023, 12:03 PM

## 2023-11-21 NOTE — Discharge Summary (Signed)
 Postpartum Discharge Summary  Date of Service updated 11-21-23     Patient Name: Marie Hampton DOB: 1990/05/31 MRN: [id number]  Date of admission: 11/19/2023 Delivery date:11/19/2023 Delivering provider: JOSHUA PALMA K Date of discharge: 11/21/2023  Admitting diagnosis: Normal labor [O80, Z37.9] Intrauterine pregnancy: [redacted]w[redacted]d     Secondary diagnosis:  Principal Problem:   Postpartum care following vaginal delivery 9/29 Active Problems:   Normal labor   SVD (spontaneous vaginal delivery)   First degree perineal laceration   Maternal anemia, with delivery  Additional problems: none    Discharge diagnosis: Term Pregnancy Delivered                                              Post partum procedures:none Complications: None  Hospital course: Onset of Labor With Vaginal Delivery      33 y.o. yo [id number] at [redacted]w[redacted]d was admitted in Active Labor on 11/19/2023. Labor course was complicated by nothing  Membrane Rupture Time/Date: 8:01 PM,11/19/2023  Delivery Method:Vaginal, Spontaneous Operative Delivery:N/A Episiotomy: None Lacerations:  1st degree Patient had a postpartum course complicated by nothing.  She is ambulating, tolerating a regular diet, passing flatus, and urinating well. Patient is discharged home in stable condition on 12/09/23.  Newborn Data: Birth date:11/19/2023 Birth time:8:29 PM Gender:Female Living status:Living Apgars:8 ,9  Weight:3.15 kg  Magnesium Sulfate received: No BMZ received: No  Immunizations administered: Immunization History  Administered Date(s) Administered   Tdap 11/23/2015    Physical exam  Vitals:   11/20/23 0754 11/20/23 1308 11/20/23 2122 11/21/23 0617  BP: 109/66 112/78 118/77 106/72  Pulse: 82 97 87 80  Resp: 18 18 18 16   Temp: 98.4 F (36.9 C) 98.3 F (36.8 C) 98.4 F (36.9 C) (!) 97.4 F (36.3 C)  TempSrc: Oral Oral Oral Axillary  SpO2: 99% 99% 99% 100%  Weight:      Height:       General: alert, cooperative, and no  distress Lochia: appropriate Uterine Fundus: FF, NT Incision: N/A DVT Evaluation: No evidence of DVT seen on physical exam. No cords or calf tenderness. Labs: Lab Results  Component Value Date   WBC 12.8 (H) 11/20/2023   HGB 9.8 (L) 11/20/2023   HCT 29.6 (L) 11/20/2023   MCV 83.1 11/20/2023   PLT 235 11/20/2023      Latest Ref Rng & Units 03/20/2023   11:38 AM  CMP  Glucose 70 - 99 mg/dL 95   BUN 6 - 20 mg/dL 9   Creatinine 9.55 - 8.99 mg/dL 9.02   Sodium 864 - 854 mmol/L 137   Potassium 3.5 - 5.1 mmol/L 4.2   Chloride 98 - 111 mmol/L 102   CO2 22 - 32 mmol/L 22   Calcium 8.9 - 10.3 mg/dL 9.5   Total Protein 6.5 - 8.1 g/dL 7.8   Total Bilirubin 0.0 - 1.2 mg/dL 0.9   Alkaline Phos 38 - 126 U/L 46   AST 15 - 41 U/L 29   ALT 0 - 44 U/L 24    Edinburgh Score:    11/21/2023   10:26 AM  Edinburgh Postnatal Depression Scale Screening Tool  I have been able to laugh and see the funny side of things. --      After visit meds:  Allergies as of 11/21/2023   No Known Allergies      Medication List  STOP taking these medications    prenatal multivitamin Tabs tablet   terconazole  0.4 % vaginal cream Commonly known as: TERAZOL 7        TAKE these medications    ibuprofen  600 MG tablet Commonly known as: ADVIL  Take 1 tablet (600 mg total) by mouth every 6 (six) hours.   Iron 325 (65 Fe) MG Tabs Take 1 tablet (325 mg total) by mouth every other day.         Discharge home in stable condition Infant Feeding: unsure Infant Disposition:home with mother Discharge instruction: per After Visit Summary and Postpartum booklet. Activity: Advance as tolerated. Pelvic rest for 6 weeks.  Diet: routine diet Anticipated Birth Control: Unsure Postpartum Appointment:6 weeks Additional Postpartum F/U: n/a Future Appointments:No future appointments. Follow up Visit:  Follow-up Information     [facility]. Schedule an appointment as  soon as possible for a visit in 6 week(s).   Specialty: Obstetrics and Gynecology Why: For Postpartum follow-up Contact information: [address] [place]  [address] [phone]                    11/21/2023 Jon CINDERELLA Rummer, MD

## 2023-11-27 ENCOUNTER — Telehealth (HOSPITAL_COMMUNITY): Payer: Self-pay

## 2023-11-27 NOTE — Telephone Encounter (Signed)
 11/27/2023 1227  Name: Marie Hampton MRN: [id number] DOB: Apr 14, 1990  Reason for Call:  Transition of Care Hospital Discharge Call  Contact Status: Patient Contact Status: Complete  Language assistant needed:          Follow-Up Questions: Do You Have Any Concerns About Your Health As You Heal From Delivery?: No Do You Have Any Concerns About Your Infants Health?: No  Edinburgh Postnatal Depression Scale:  In the Past 7 Days: I have been able to laugh and see the funny side of things.: As much as I always could I have looked forward with enjoyment to things.: As much as I ever did I have blamed myself unnecessarily when things went wrong.: No, never I have been anxious or worried for no good reason.: No, not at all I have felt scared or panicky for no good reason.: No, not at all Things have been getting on top of me.: No, I have been coping as well as ever I have been so unhappy that I have had difficulty sleeping.: Not at all I have felt sad or miserable.: No, not at all I have been so unhappy that I have been crying.: No, never The thought of harming myself has occurred to me.: Never [place] Postnatal Depression Scale Total: 0  PHQ2-9 Depression Scale:     Discharge Follow-up: Edinburgh score requires follow up?: No Patient was advised of the following resources:: Breastfeeding Support Group, Support Group  Post-discharge interventions: Reviewed Newborn Safe Sleep Practices  Signature  Rosaline Deretha PEAK
